# Patient Record
Sex: Female | Born: 1940
Health system: Southern US, Community
[De-identification: ages and names within clinical notes are randomized; demographics above are authoritative.]

## PROBLEM LIST (undated history)

## (undated) DIAGNOSIS — E785 Hyperlipidemia, unspecified: Secondary | ICD-10-CM

## (undated) DIAGNOSIS — T7840XA Allergy, unspecified, initial encounter: Secondary | ICD-10-CM

## (undated) DIAGNOSIS — F419 Anxiety disorder, unspecified: Secondary | ICD-10-CM

## (undated) DIAGNOSIS — K635 Polyp of colon: Secondary | ICD-10-CM

## (undated) DIAGNOSIS — R011 Cardiac murmur, unspecified: Secondary | ICD-10-CM

## (undated) DIAGNOSIS — I509 Heart failure, unspecified: Secondary | ICD-10-CM

## (undated) DIAGNOSIS — I519 Heart disease, unspecified: Secondary | ICD-10-CM

## (undated) DIAGNOSIS — I10 Essential (primary) hypertension: Secondary | ICD-10-CM

## (undated) HISTORY — PX: ABDOMINAL HYSTERECTOMY: SHX81

## (undated) HISTORY — DX: Heart failure, unspecified: I50.9

## (undated) HISTORY — PX: BREAST EXCISIONAL BIOPSY: SUR124

## (undated) HISTORY — DX: Polyp of colon: K63.5

## (undated) HISTORY — PX: BREAST SURGERY: SHX581

## (undated) HISTORY — DX: Allergy, unspecified, initial encounter: T78.40XA

---

## 1999-05-19 ENCOUNTER — Encounter: Payer: Self-pay | Admitting: Obstetrics & Gynecology

## 1999-05-19 ENCOUNTER — Encounter: Admission: RE | Admit: 1999-05-19 | Discharge: 1999-05-19 | Payer: Self-pay | Admitting: Family Medicine

## 2000-05-23 ENCOUNTER — Encounter: Payer: Self-pay | Admitting: Obstetrics & Gynecology

## 2000-05-23 ENCOUNTER — Encounter: Admission: RE | Admit: 2000-05-23 | Discharge: 2000-05-23 | Payer: Self-pay | Admitting: Obstetrics & Gynecology

## 2001-05-28 ENCOUNTER — Encounter: Admission: RE | Admit: 2001-05-28 | Discharge: 2001-05-28 | Payer: Self-pay | Admitting: Obstetrics & Gynecology

## 2001-05-28 ENCOUNTER — Encounter: Payer: Self-pay | Admitting: Obstetrics & Gynecology

## 2001-07-19 ENCOUNTER — Ambulatory Visit (HOSPITAL_BASED_OUTPATIENT_CLINIC_OR_DEPARTMENT_OTHER): Admission: RE | Admit: 2001-07-19 | Discharge: 2001-07-19 | Payer: Self-pay | Admitting: General Surgery

## 2001-11-12 ENCOUNTER — Encounter: Admission: RE | Admit: 2001-11-12 | Discharge: 2001-11-12 | Payer: Self-pay | Admitting: Obstetrics & Gynecology

## 2001-11-12 ENCOUNTER — Encounter: Payer: Self-pay | Admitting: Obstetrics & Gynecology

## 2002-05-29 ENCOUNTER — Encounter: Admission: RE | Admit: 2002-05-29 | Discharge: 2002-05-29 | Payer: Self-pay | Admitting: Obstetrics & Gynecology

## 2002-05-29 ENCOUNTER — Encounter: Payer: Self-pay | Admitting: Obstetrics & Gynecology

## 2003-06-04 ENCOUNTER — Ambulatory Visit (HOSPITAL_COMMUNITY): Admission: RE | Admit: 2003-06-04 | Discharge: 2003-06-04 | Payer: Self-pay | Admitting: General Surgery

## 2003-12-09 ENCOUNTER — Encounter (INDEPENDENT_AMBULATORY_CARE_PROVIDER_SITE_OTHER): Payer: Self-pay | Admitting: Specialist

## 2003-12-09 ENCOUNTER — Ambulatory Visit (HOSPITAL_COMMUNITY): Admission: EM | Admit: 2003-12-09 | Discharge: 2003-12-09 | Payer: Self-pay | Admitting: *Deleted

## 2004-01-06 ENCOUNTER — Ambulatory Visit (HOSPITAL_COMMUNITY): Admission: RE | Admit: 2004-01-06 | Discharge: 2004-01-06 | Payer: Self-pay | Admitting: Obstetrics & Gynecology

## 2004-05-20 ENCOUNTER — Ambulatory Visit: Payer: Self-pay | Admitting: Cardiology

## 2004-06-01 ENCOUNTER — Ambulatory Visit: Payer: Self-pay

## 2004-06-07 ENCOUNTER — Ambulatory Visit (HOSPITAL_COMMUNITY): Admission: RE | Admit: 2004-06-07 | Discharge: 2004-06-07 | Payer: Self-pay | Admitting: Obstetrics & Gynecology

## 2004-07-11 ENCOUNTER — Ambulatory Visit: Payer: Self-pay

## 2005-06-08 ENCOUNTER — Ambulatory Visit (HOSPITAL_COMMUNITY): Admission: RE | Admit: 2005-06-08 | Discharge: 2005-06-08 | Payer: Self-pay | Admitting: Obstetrics & Gynecology

## 2005-06-13 ENCOUNTER — Ambulatory Visit: Payer: Self-pay | Admitting: Cardiology

## 2005-06-20 ENCOUNTER — Encounter: Payer: Self-pay | Admitting: Cardiology

## 2005-06-20 ENCOUNTER — Ambulatory Visit (HOSPITAL_COMMUNITY): Admission: RE | Admit: 2005-06-20 | Discharge: 2005-06-20 | Payer: Self-pay | Admitting: Cardiology

## 2005-06-20 ENCOUNTER — Ambulatory Visit: Payer: Self-pay | Admitting: Cardiology

## 2005-06-26 ENCOUNTER — Encounter: Admission: RE | Admit: 2005-06-26 | Discharge: 2005-06-26 | Payer: Self-pay | Admitting: Obstetrics & Gynecology

## 2005-07-03 ENCOUNTER — Ambulatory Visit: Payer: Self-pay | Admitting: Cardiology

## 2005-07-17 ENCOUNTER — Ambulatory Visit: Payer: Self-pay | Admitting: Cardiology

## 2005-07-19 ENCOUNTER — Ambulatory Visit: Payer: Self-pay | Admitting: Cardiology

## 2005-07-19 ENCOUNTER — Inpatient Hospital Stay (HOSPITAL_BASED_OUTPATIENT_CLINIC_OR_DEPARTMENT_OTHER): Admission: RE | Admit: 2005-07-19 | Discharge: 2005-07-19 | Payer: Self-pay | Admitting: Cardiology

## 2005-08-08 HISTORY — PX: MITRAL VALVE REPAIR: SHX2039

## 2005-10-12 ENCOUNTER — Ambulatory Visit: Payer: Self-pay | Admitting: Cardiology

## 2005-10-13 ENCOUNTER — Ambulatory Visit: Payer: Self-pay

## 2005-10-13 ENCOUNTER — Encounter: Payer: Self-pay | Admitting: Cardiology

## 2005-10-26 ENCOUNTER — Encounter (HOSPITAL_COMMUNITY): Admission: RE | Admit: 2005-10-26 | Discharge: 2006-01-24 | Payer: Self-pay | Admitting: Cardiology

## 2005-11-30 ENCOUNTER — Ambulatory Visit: Payer: Self-pay | Admitting: Cardiology

## 2005-12-04 ENCOUNTER — Ambulatory Visit: Payer: Self-pay | Admitting: Cardiology

## 2005-12-14 ENCOUNTER — Ambulatory Visit: Payer: Self-pay | Admitting: Cardiology

## 2006-01-04 ENCOUNTER — Ambulatory Visit: Payer: Self-pay | Admitting: Internal Medicine

## 2006-01-23 ENCOUNTER — Encounter: Payer: Self-pay | Admitting: Cardiology

## 2006-01-23 ENCOUNTER — Ambulatory Visit: Payer: Self-pay

## 2006-01-30 ENCOUNTER — Ambulatory Visit: Payer: Self-pay | Admitting: Internal Medicine

## 2006-03-08 ENCOUNTER — Ambulatory Visit: Payer: Self-pay | Admitting: Nurse Practitioner

## 2006-03-30 ENCOUNTER — Ambulatory Visit: Payer: Self-pay | Admitting: Internal Medicine

## 2006-05-08 ENCOUNTER — Ambulatory Visit: Payer: Self-pay | Admitting: Cardiology

## 2006-06-14 ENCOUNTER — Ambulatory Visit: Payer: Self-pay | Admitting: Cardiology

## 2006-07-05 ENCOUNTER — Ambulatory Visit (HOSPITAL_COMMUNITY): Admission: RE | Admit: 2006-07-05 | Discharge: 2006-07-05 | Payer: Self-pay | Admitting: Obstetrics & Gynecology

## 2007-01-03 ENCOUNTER — Ambulatory Visit: Payer: Self-pay | Admitting: Internal Medicine

## 2007-01-16 ENCOUNTER — Ambulatory Visit: Payer: Self-pay

## 2007-01-16 ENCOUNTER — Ambulatory Visit: Payer: Self-pay | Admitting: Internal Medicine

## 2007-01-16 LAB — CONVERTED CEMR LAB
AST: 20 units/L (ref 0–37)
Albumin: 4.2 g/dL (ref 3.5–5.2)
Alkaline Phosphatase: 48 units/L (ref 39–117)
CO2: 30 meq/L (ref 19–32)
Cholesterol: 144 mg/dL (ref 0–200)
Creatinine, Ser: 1 mg/dL (ref 0.4–1.2)
GFR calc Af Amer: 72 mL/min
HDL: 49.9 mg/dL (ref >39.0)
Total Bilirubin: 0.6 mg/dL (ref 0.3–1.2)
Triglycerides: 71 mg/dL (ref 0–149)
VLDL: 14 mg/dL (ref 0–40)

## 2007-07-09 ENCOUNTER — Encounter: Admission: RE | Admit: 2007-07-09 | Discharge: 2007-07-09 | Payer: Self-pay | Admitting: Obstetrics & Gynecology

## 2007-07-09 ENCOUNTER — Ambulatory Visit: Payer: Self-pay | Admitting: Internal Medicine

## 2007-07-09 LAB — CONVERTED CEMR LAB
ALT: 19 units/L (ref 0–35)
AST: 20 units/L (ref 0–37)
Alkaline Phosphatase: 48 units/L (ref 39–117)
BUN: 20 mg/dL (ref 6–23)
Bilirubin, Direct: 0.1 mg/dL (ref 0.0–0.3)
CO2: 32 meq/L (ref 19–32)
Chloride: 106 meq/L (ref 96–112)
Creatinine, Ser: 1 mg/dL (ref 0.4–1.2)
HDL: 59 mg/dL (ref >39.0)
Total Protein: 6.9 g/dL (ref 6.0–8.3)

## 2007-10-30 ENCOUNTER — Ambulatory Visit: Payer: Self-pay | Admitting: Internal Medicine

## 2008-05-07 ENCOUNTER — Encounter: Admission: RE | Admit: 2008-05-07 | Discharge: 2008-05-07 | Payer: Self-pay | Admitting: Obstetrics & Gynecology

## 2008-05-08 ENCOUNTER — Encounter: Admission: RE | Admit: 2008-05-08 | Discharge: 2008-05-08 | Payer: Self-pay | Admitting: Obstetrics & Gynecology

## 2008-07-09 ENCOUNTER — Encounter: Admission: RE | Admit: 2008-07-09 | Discharge: 2008-07-09 | Payer: Self-pay | Admitting: Obstetrics & Gynecology

## 2008-07-30 ENCOUNTER — Telehealth: Payer: Self-pay | Admitting: Internal Medicine

## 2008-09-04 ENCOUNTER — Encounter (INDEPENDENT_AMBULATORY_CARE_PROVIDER_SITE_OTHER): Payer: Self-pay | Admitting: *Deleted

## 2008-12-03 ENCOUNTER — Ambulatory Visit: Payer: Self-pay

## 2008-12-03 ENCOUNTER — Encounter: Payer: Self-pay | Admitting: Internal Medicine

## 2008-12-12 DIAGNOSIS — I2510 Atherosclerotic heart disease of native coronary artery without angina pectoris: Secondary | ICD-10-CM | POA: Insufficient documentation

## 2008-12-12 DIAGNOSIS — I080 Rheumatic disorders of both mitral and aortic valves: Secondary | ICD-10-CM | POA: Insufficient documentation

## 2008-12-18 ENCOUNTER — Ambulatory Visit: Payer: Self-pay | Admitting: Internal Medicine

## 2009-01-19 ENCOUNTER — Telehealth: Payer: Self-pay | Admitting: Internal Medicine

## 2009-02-15 ENCOUNTER — Encounter: Admission: RE | Admit: 2009-02-15 | Discharge: 2009-02-15 | Payer: Self-pay | Admitting: Obstetrics & Gynecology

## 2009-03-18 ENCOUNTER — Encounter: Payer: Self-pay | Admitting: Internal Medicine

## 2009-03-18 ENCOUNTER — Ambulatory Visit: Payer: Self-pay | Admitting: Cardiology

## 2009-03-18 ENCOUNTER — Ambulatory Visit: Payer: Self-pay

## 2009-03-18 ENCOUNTER — Ambulatory Visit: Payer: Self-pay | Admitting: Internal Medicine

## 2009-03-18 ENCOUNTER — Ambulatory Visit (HOSPITAL_COMMUNITY): Admission: RE | Admit: 2009-03-18 | Discharge: 2009-03-18 | Payer: Self-pay | Admitting: Internal Medicine

## 2009-07-27 ENCOUNTER — Encounter: Admission: RE | Admit: 2009-07-27 | Discharge: 2009-07-27 | Payer: Self-pay | Admitting: Obstetrics & Gynecology

## 2009-08-02 ENCOUNTER — Encounter: Admission: RE | Admit: 2009-08-02 | Discharge: 2009-08-02 | Payer: Self-pay | Admitting: Obstetrics & Gynecology

## 2009-09-22 ENCOUNTER — Ambulatory Visit: Payer: Self-pay | Admitting: Internal Medicine

## 2010-02-14 ENCOUNTER — Telehealth: Payer: Self-pay | Admitting: Internal Medicine

## 2010-02-28 ENCOUNTER — Telehealth: Payer: Self-pay | Admitting: Internal Medicine

## 2010-03-08 ENCOUNTER — Ambulatory Visit: Payer: Self-pay | Admitting: Cardiology

## 2010-03-08 ENCOUNTER — Encounter: Payer: Self-pay | Admitting: Internal Medicine

## 2010-03-08 ENCOUNTER — Ambulatory Visit: Payer: Self-pay | Admitting: Internal Medicine

## 2010-03-08 ENCOUNTER — Ambulatory Visit (HOSPITAL_COMMUNITY): Admission: RE | Admit: 2010-03-08 | Discharge: 2010-03-08 | Payer: Self-pay | Admitting: Internal Medicine

## 2010-03-16 ENCOUNTER — Ambulatory Visit: Payer: Self-pay | Admitting: Internal Medicine

## 2010-03-31 ENCOUNTER — Encounter: Payer: Self-pay | Admitting: Internal Medicine

## 2010-05-01 ENCOUNTER — Encounter: Payer: Self-pay | Admitting: Obstetrics & Gynecology

## 2010-05-10 NOTE — Progress Notes (Signed)
Summary: returned call from last week  Phone Note Call from Patient   Caller: Patient [phone] Reason for Call: Talk to Nurse Summary of Call: pt returned call from last week/mt Initial call taken by: Glynda Jaeger,  February 28, 2010 10:21 AM  Follow-up for Phone Call        Phone Call Completed PT RESTARTED SIMVASTATIN  HAS PAIN IN L SHOULDER WITH SOME  RADIATION TO WRIST FEELS LIKE STRAIN MUSCLE INSTRUCTED PT TO CONT TAKING STATIN VERBALIZED UNDERSTANDING Follow-up by: Scherrie Bateman, LPN,  February 28, 2010 11:33 AM

## 2010-05-10 NOTE — Assessment & Plan Note (Signed)
Summary: f60m   Visit Type:  6 mo f/u Primary Provider:  Lenn Sink, MD   History of Present Illness: Caitlyn Dunn is a very pleasant 70 year old woman with a history of mitral regurgitation status post mitral valve repair with a ring by Dr. Silvestre Mesi in May 2007.  Preoperatively, her EF was 55%-60% with normal coronaries.  However, postoperatively it dropped down to 25% for unclear reasons.  Echocadriogram 12/10 showed an ejection fraction 45-50% with LVIDed 41mm with mild MR.    She comes today for routine followup. Remains very active with daily activities including yard work, vaccuuming but not exercising regularly. No CP or SOB. No edema or palpitations. Taking Diovan and Coreg regularly.    Current Medications (verified): 1)  Carvedilol 25 Mg Tabs (Carvedilol) .... Take One Tablet By Mouth Twice A Day 2)  Simvastatin 40 Mg Tabs (Simvastatin) .... Take 1 Tablet By Mouth At Bedtime 3)  Diovan 40 Mg Tabs (Valsartan) .... Take One Tablet By Mouth Two Times A Day 4)  Aspirin 81 Mg Tbec (Aspirin) .... Take One Tablet By Mouth Daily 5)  Multivitamins   Tabs (Multiple Vitamin) .... 3 Times Per Week 6)  Calcium Carbonate-Vitamin D 600-400 Mg-Unit  Tabs (Calcium Carbonate-Vitamin D) .... As Needed 7)  Oscal 500/200 D-3 500-200 Mg-Unit Tabs (Calcium-Vitamin D) .... As Needed 8)  Omeprazole 40 Mg Cpdr (Omeprazole) .... Take One Tablet By Mouth Once Daily. 9)  Tums 500 Mg Chew (Calcium Carbonate Antacid) .... As Needed  Allergies: 1)  ! Amoxicillin 2)  ! Demerol 3)  ! Quinine 4)  ! Tylenol  Past History:  Past Medical History: 1. History of mitral regurgitation status post mitral valve repair with a ring by Dr. Silvestre Mesi in May 2007.  2. LV dyfunction     --pre-op 55-60%     --post-op EF 25%     --Echo 12/10: EF 45-50% mild MR 2. HTN 3. Hyperlipidemia 4. Anxiety 5. Famil history of CAD  Review of Systems       As per HPI and past medical history; otherwise all systems  negative.   Vital Signs:  Patient profile:   70 year old female Height:      63 inches Weight:      150 pounds BMI:     26.67 Pulse rate:   68 / minute Pulse rhythm:   regular BP sitting:   124 / 80  (left arm) Cuff size:   regular  Vitals Entered By: Danielle Rankin, CMA (September 22, 2009 9:59 AM)  Physical Exam  General:  Gen: well appearing. no resp difficulty HEENT: normal Neck: supple. no JVD. Carotids 2+ bilat; no bruits. No lymphadenopathy or thryomegaly appreciated. Cor: PMI nondisplaced. Regular rate & rhythm. No rubs, gallops, murmur. Lungs: clear Abdomen: soft, nontender, nondistended. No hepatosplenomegaly. No bruits or masses. Good bowel sounds. Extremities: no cyanosis, clubbing, rash, edema Neuro: alert & orientedx3, cranial nerves grossly intact. moves all 4 extremities w/o difficulty. affect pleasant    Impression & Recommendations:  Problem # 1:  LEFT VENTRICULAR FUNCTION, DECREASED (ICD-429.2) Doing great. LV function essentially completely recovered. We briefly discussed raiding Diovan but I think current dose is sufficient as she has recovered nicely and is totally asymptomatic. Will repeat echo in 6 months.  Problem # 2:  MITRAL REGURGITATION (ICD-396.3) Doing well s/p repair. she will continue SBE prophylaxis as she has an abnormal valve.  Other Orders: EKG w/ Interpretation (93000)  Patient Instructions: 1)  Your physician has requested that  you have an echocardiogram.  Echocardiography is a painless test that uses sound waves to create images of your heart. It provides your doctor with information about the size and shape of your heart and how well your heart's chambers and valves are working.  This procedure takes approximately one hour. There are no restrictions for this procedure.  Needs in 6 months 2)  Your physician wants you to follow-up in:  6 months. You will receive a reminder letter in the mail two months in advance. If you don't receive a letter,  please call our office to schedule the follow-up appointment.

## 2010-05-10 NOTE — Progress Notes (Signed)
Summary: re joint pain  Phone Note Call from Patient   Caller: Patient Summary of Call: pt having joint pain-pt on simvastatin-[phone] Initial call taken by: Omer Jack,  February 14, 2010 1:57 PM  Follow-up for Phone Call        02/14/10--1700pm--pt states she has been on statin for "years" but is now holding as she has developed pain in joints and generalized achy feeling--she has d/ced statin and will wait until you phone back with something else for her to try--advised to hold statin until we get back to her--nt Follow-up by: Ledon Snare, RN,  February 14, 2010 5:25 PM     Appended Document: re joint pain In looking through clinic notes it doesn't appear as if we have been managing lipids. is that the case? if so, she should contact PCP to discuss.   Appended Document: re joint pain we follow lipids, per Dr Gala Romney call and check on pt if better have her restart simva and see if pain returns, have called pt and Left message to call back   Appended Document: re joint pain Left message to call back

## 2010-05-12 NOTE — Assessment & Plan Note (Signed)
Summary: f3m   Visit Type:  Follow-up Primary Provider:  Lenn Sink, MD  CC:  arm pain.  History of Present Illness: Caitlyn Dunn  is a very pleasant 70 year old woman with a history of mitral regurgitation status post mitral valve repair with a ring by Dr. Silvestre Mesi in May 2007.  Preoperatively, her EF was 55%-60% with normal coronaries.  However, postoperatively it dropped down to 25% for unclear reasons.  Echocadriogram 12/10 showed an ejection fraction 45-50% with LVIDed 41mm with mild MR.    Echo 11/11 showed EF 55-60% with no MR. (reviewed with her today in clinic)  Doing well from a cardiac standpoint though not very active. No CP, SOB or edema. Having problems with pain in left shoulder. Compliant with meds. BP well controlled but feels fatigued after taking meds.  Current Medications (verified): 1)  Carvedilol 25 Mg Tabs (Carvedilol) .... Take One Tablet By Mouth Twice A Day 2)  Simvastatin 40 Mg Tabs (Simvastatin) .... Take 1 Tablet By Mouth At Bedtime 3)  Diovan 40 Mg Tabs (Valsartan) .... Take One Tablet By Mouth Two Times A Day 4)  Aspirin 81 Mg Tbec (Aspirin) .... Take One Tablet By Mouth Daily 5)  Multivitamins   Tabs (Multiple Vitamin) .Marland Kitchen.. 1-2 A Week Maybe 6)  Calcium Carbonate-Vitamin D 600-400 Mg-Unit  Tabs (Calcium Carbonate-Vitamin D) .... Once in Awhile 7)  Oscal 500/200 D-3 500-200 Mg-Unit Tabs (Calcium-Vitamin D) .... Once in Awhile 8)  Omeprazole 40 Mg Cpdr (Omeprazole) .... Take One Tablet By Mouth Once Daily. 9)  Clindamycin Hcl 150 Mg Caps (Clindamycin Hcl) .... 4 Tabs Prior To Dental Work  Allergies (verified): 1)  ! Amoxicillin 2)  ! Demerol 3)  ! Quinine  Past History:  Past Medical History: Last updated: 09/22/2009 1. History of mitral regurgitation status post mitral valve repair with a ring by Dr. Silvestre Mesi in May 2007.  2. LV dyfunction     --pre-op 55-60%     --post-op EF 25%     --Echo 12/10: EF 45-50% mild MR 2. HTN 3. Hyperlipidemia 4.  Anxiety 5. Famil history of CAD  Review of Systems       As per HPI and past medical history; otherwise all systems negative.   Vital Signs:  Patient profile:   70 year old female Height:      63 inches Weight:      154 pounds BMI:     27.38 Pulse rate:   61 / minute BP sitting:   122 / 74  (left arm) Cuff size:   regular  Vitals Entered By: Hardin Negus, RMA (March 31, 2010 11:07 AM)   Physical Exam  General:  Well appearing. no resp difficulty HEENT: normal Neck: supple. no JVD. Carotids 2+ bilat; no bruits. No lymphadenopathy or thryomegaly appreciated. Cor: PMI nondisplaced. Regular rate & rhythm. No rubs, gallops, murmur. Lungs: clear Abdomen: soft, nontender, nondistended. No hepatosplenomegaly. No bruits or masses. Good bowel sounds. Extremities: no cyanosis, clubbing, rash, edema Neuro: alert & orientedx3, cranial nerves grossly intact. moves all 4 extremities w/o difficulty. affect pleasant    Impression & Recommendations:  Problem # 1:  MITRAL REGURGITATION (ICD-396.3) Doing well s/p repair. No MR on exam or by echo.   Problem # 2:  LEFT VENTRICULAR FUNCTION, DECREASED (ICD-429.2) Doing great. LV function  completely recovered. Given fatigue will try decreasing carvedilol to 12.5 two times a day and see if she feels better.   Other Orders: EKG w/ Interpretation (93000)  Patient Instructions: 1)  Decrease Carvedilol to 12.5mg  two times a day  2)  Your physician has requested that you have an echocardiogram.  Echocardiography is a painless test that uses sound waves to create images of your heart. It provides your doctor with information about the size and shape of your heart and how well your heart's chambers and valves are working.  This procedure takes approximately one hour. There are no restrictions for this procedure.  NEEDS IN 1 YEAR. 3)  Your physician wants you to follow-up in:  1 year.  You will receive a reminder letter in the mail two months in  advance. If you don't receive a letter, please call our office to schedule the follow-up appointment.

## 2010-07-06 ENCOUNTER — Inpatient Hospital Stay (INDEPENDENT_AMBULATORY_CARE_PROVIDER_SITE_OTHER)
Admission: RE | Admit: 2010-07-06 | Discharge: 2010-07-06 | Disposition: A | Discharge status: Home / Self Care | Payer: MEDICARE | Source: Ambulatory Visit | Attending: Emergency Medicine | Admitting: Emergency Medicine

## 2010-07-06 ENCOUNTER — Encounter: Payer: Self-pay | Admitting: Emergency Medicine

## 2010-07-06 ENCOUNTER — Ambulatory Visit
Admission: RE | Admit: 2010-07-06 | Discharge: 2010-07-06 | Disposition: A | Discharge status: Home / Self Care | Payer: Medicare Other | Source: Ambulatory Visit | Attending: Emergency Medicine | Admitting: Emergency Medicine

## 2010-07-06 ENCOUNTER — Other Ambulatory Visit: Payer: Self-pay | Admitting: Emergency Medicine

## 2010-07-06 DIAGNOSIS — M25579 Pain in unspecified ankle and joints of unspecified foot: Secondary | ICD-10-CM

## 2010-07-12 NOTE — Assessment & Plan Note (Signed)
Summary: POSSIBLE SPRAIN OF LEFT ANKLE? NH rm 5   Vital Signs:  Patient Profile:   70 Years Old Female CC:      LT ankle injury last night Height:     63 inches Weight:      152 pounds O2 Sat:      97 % O2 treatment:    Room Air Temp:     98.3 degrees F oral Pulse rate:   70 / minute Resp:     16 per minute BP sitting:   117 / 75  (left arm) Cuff size:   regular  Vitals Entered By: Clemens Catholic LPN (July 06, 2010 10:45 AM)                  Updated Prior Medication List: CARVEDILOL 12.5 MG TABS (CARVEDILOL) Take one tablet by mouth twice a day SIMVASTATIN 40 MG TABS (SIMVASTATIN) Take 1 tablet by mouth at bedtime DIOVAN 40 MG TABS (VALSARTAN) Take one tablet by mouth two times a day ASPIRIN 81 MG TBEC (ASPIRIN) Take one tablet by mouth daily MULTIVITAMINS   TABS (MULTIPLE VITAMIN) 1-2 a week maybe CALCIUM CARBONATE-VITAMIN D 600-400 MG-UNIT  TABS (CALCIUM CARBONATE-VITAMIN D) once in awhile OSCAL 500/200 D-3 500-200 MG-UNIT TABS (CALCIUM-VITAMIN D) once in awhile OMEPRAZOLE 40 MG CPDR (OMEPRAZOLE) Take one tablet by mouth once daily. CLINDAMYCIN HCL 150 MG CAPS (CLINDAMYCIN HCL) 4 tabs prior to dental work  Current Allergies (reviewed today): ! AMOXICILLIN ! DEMEROL ! QUININEHistory of Present Illness History from: patient Chief Complaint: LT ankle injury last night History of Present Illness: Was walking down her carpeted stairs last night, tripped/slipped and caught herself.  Had ankle pain and has improved a little since then.  She has been wearing a boot today (had it since she broke her L foot a few years ago).  She wants to ensure it's not broken prior to going to [facility] later today.  Pain is sore and on the outside of her ankle.  REVIEW OF SYSTEMS Constitutional Symptoms      Denies fever, chills, night sweats, weight loss, weight gain, and fatigue.  Eyes       Denies change in vision, eye pain, eye discharge, glasses, contact lenses, and eye  surgery. Ear/Nose/Throat/Mouth       Denies hearing loss/aids, change in hearing, ear pain, ear discharge, dizziness, frequent runny nose, frequent nose bleeds, sinus problems, sore throat, hoarseness, and tooth pain or bleeding.  Respiratory       Denies dry cough, productive cough, wheezing, shortness of breath, asthma, bronchitis, and emphysema/COPD.  Cardiovascular       Denies murmurs, chest pain, and tires easily with exhertion.    Gastrointestinal       Denies stomach pain, nausea/vomiting, diarrhea, constipation, blood in bowel movements, and indigestion. Genitourniary       Denies painful urination, kidney stones, and loss of urinary control. Neurological       Denies paralysis, seizures, and fainting/blackouts. Musculoskeletal       Complains of joint pain.      Denies muscle pain, joint stiffness, decreased range of motion, redness, swelling, muscle weakness, and gout.  Skin       Denies bruising, unusual mles/lumps or sores, and hair/skin or nail changes.  Psych       Denies mood changes, temper/anger issues, anxiety/stress, speech problems, depression, and sleep problems. Other Comments: pt states that she fell last night going down the stairs in her house and now she  has LT ankle pain. she put on a cam boot walker that she had from a previous foot surgery and that seemed to help. she took 2- 81mg  ASA this AM, she usually only takes one.   Past History:  Past Medical History: Reviewed history from 09/22/2009 and no changes required. 1. History of mitral regurgitation status post mitral valve repair with a ring by Dr. Silvestre Mesi in May 2007.  2. LV dyfunction     --pre-op 55-60%     --post-op EF 25%     --Echo 12/10: EF 45-50% mild MR 2. HTN 3. Hyperlipidemia 4. Anxiety 5. Famil history of CAD  Past Surgical History: Hysterectomy Mitral valve repair  Family History: mom- Afib/ CVA  Social History: Never Smoked Alcohol use-yes 7 -10 drinks per wk Drug  use-no Smoking Status:  never Drug Use:  no Physical Exam General appearance: well developed, well nourished, mild istress MSE: oriented to time, place, and person L ankle: FROM, full strength, resisted motions not painful.  Mild TTP ATFL and anterior ankle.  No TTP medial/lateral malleolus, navicular, base of 5th, calcaneus, Achilles, or proximal fibula.  No swelling.  No ecchymoses.  Assessment New Problems: ANKLE PAIN (ICD-719.47)   Plan New Orders: New Patient Level III [99203] T-DG Ankle Complete*L* [73610] Planning Comments:   Xray of L ankle obtained.  Read by radiology as normal.  Patient with ankle sprain.  She can continue her boot (but no driving) for a few days. Then gradual return to normalcy.  NSAIDs and ice as needed for pain and swelling.  Consider PT for more ankle strengthening if becomes a recurrant problem.   The patient and/or caregiver has been counseled thoroughly with regard to medications prescribed including dosage, schedule, interactions, rationale for use, and possible side effects and they verbalize understanding.  Diagnoses and expected course of recovery discussed and will return if not improved as expected or if the condition worsens. Patient and/or caregiver verbalized understanding.   Orders Added: 1)  New Patient Level III [99203] 2)  T-DG Ankle Complete*L* [16109]

## 2010-08-15 ENCOUNTER — Other Ambulatory Visit: Payer: Self-pay | Admitting: Obstetrics & Gynecology

## 2010-08-15 ENCOUNTER — Other Ambulatory Visit (HOSPITAL_COMMUNITY): Payer: Self-pay | Admitting: Obstetrics & Gynecology

## 2010-08-15 DIAGNOSIS — Z1231 Encounter for screening mammogram for malignant neoplasm of breast: Secondary | ICD-10-CM

## 2010-08-23 NOTE — Assessment & Plan Note (Signed)
[facility]                            CARDIOLOGY OFFICE NOTE   Caitlyn Dunn, Caitlyn Dunn                      MRN:          [id number]  DATE:10/30/2007                            DOB:          1940/12/19    PRIMARY CARE PHYSICIAN:  Jethro Bastos, MD   INTERVAL HISTORY:  Caitlyn Dunn is a very pleasant 70 year old woman with  a history of mitral regurgitation status post mitral valve repair with a  ring by Dr. Durwin Glaze in May 2007.  Preoperatively, her EF was 55%-60% with  normal coronaries.  However, postoperatively it dropped down to 25% for  unclear reasons.  She is stabilized with an ejection fraction of 45%-  50%.  The last echocardiogram was in October 2008.  There was no  evidence of mitral regurgitation.   She comes today for routine followup.  She is doing great.  She denies  any chest pain or shortness of breath.  Unfortunately, she is not really  working out consistently.  She is following with Dr. Sharrell Ku for  weight loss and has managed to keep her weight in the 145 range,  although she would like to be under 140.  She has not had any problems  with heart failure.   CURRENT MEDICATIONS:  Multivitamin, aspirin 81, Atacand 4 mg a day,  Coreg 25 b.i.d., Tums, and simvastatin 40 bedtime.   DRUG INTOLERANCES/ALLERGIES:  ACE inhibitor causes cough.   PHYSICAL EXAMINATION:  Well-appearing, no acute distress.  Ambulatory on  the clinic without any respiratory difficulty.  Blood pressure is  114/72, heart rate 64, and weight is 146.  HEENT is normal.  Neck is  supple.  No JVD.  Carotids are 2+ bilaterally without any bruits.  There  is no lymphadenopathy or thyromegaly.  PMI is nondisplaced.  She has  regular rate and rhythm.  No murmurs, rubs, or gallops.  No mitral  regurgitation.  Lungs are clear.  Abdomen is soft, nontender, and  nondistended.  No hepatosplenomegaly.  No bruits.  No masses.  Good  bowel sounds.  Extremities are warm.   No cyanosis, clubbing, or edema.  No rash.  Neuro alert and oriented x3.  Cranial nerves II-XII are  intact.  Moves all 4 extremities without difficulty.  Affect is  pleasant.   EKG shows sinus rhythm at a rate of 64, no ST-T wave abnormalities.   ASSESSMENT AND PLAN:  1. Mitral regurgitation status post mitral valve repair.  This is      stable.  We will check her echocardiogram every 2 years.  2. Left ventricular dysfunction.  This is essentially recovered.  Her      EF is low normal.  She is asymptomatic.  She is on a good medical      regimen.  We will keep her on this.  3. Hypertension.  This is stable, well controlled.  Continue current      regimen.   PREVENTIVE MEDICINE:  I did remind her of the need to add exercise into  her program for preserved cardiovascular health.   DISPOSITION:  We  will see her back in 1 year for a routine followup.     Caitlyn Dunn. Bensimhon, MD  Electronically Signed    DRB/MedQ  DD: 10/30/2007  DT: 10/30/2007  Job #: [id number]   cc:   Caitlyn Dunn, M.D.

## 2010-08-23 NOTE — Assessment & Plan Note (Signed)
[facility]                            CARDIOLOGY OFFICE NOTE   Caitlyn Dunn, Caitlyn Dunn                      MRN:          [id number]  DATE:01/03/2007                            DOB:          12-19-1940    PRIMARY CARE PHYSICIAN:  Jethro Bastos, M.D.   INTERVAL HISTORY:  Caitlyn Dunn is a very pleasant 70 year old woman,  previously followed by Dr. Samule Ohm.  She has a history of mitral  regurgitation and is status post mitral valve repair with a ring by Dr.  Silvestre Mesi in May of 2007.  Preoperatively, her EF was 55-60%.  However,  postoperatively, it dropped down to 25%.  Most recent echocardiogram in  October of 2007 showed an EF of 40-50%.  The mitral repair was stable  with no significant mitral regurgitation.   She comes today for routine followup.  She says she is doing very well.  She denies any chest pain or shortness of breath.  She is currently  working with Dr. Ritta Slot to lose weight and she has lost 15 pounds.  She has also started an exercise class and she denies any chest pain or  shortness of breath.  She is somewhat frustrated that her weight has  plateaued and she is unable to get down to her goal of 135.   CURRENT MEDICATIONS:  1. Simvastatin 80 a day.  2. Nexium 40 a day.  3. Aspirin 81.  4. Atacand 4.  5. Coreg 25 b.i.d.  6. Tums.  7. Calcium.  8. Also a multivitamin.   DRUG INTOLERANCES/ALLERGIES:  ACE INHIBITOR causes cough.   PHYSICAL EXAM:  She is well-appearing, in no acute distress, ambulates  around the clinic without any respiratory difficulty.  Blood pressure is 114/80, heart rate 67, weight is 149.  HEENT:  Normal.  NECK:  Supple, no JVD.  Carotids are 2+ bilaterally without any bruits.  There is no lymphadenopathy or thyromegaly.  CARDIAC:  PMI is nondisplaced.  She has a regular rate and rhythm, no  murmurs, rubs or gallops, no clicks.  There is no mitral regurgitation.  LUNGS:  Clear.  ABDOMEN:  Soft,  nontender, nondistended.  There is no  hepatosplenomegaly, no bruits, no masses, good bowel sounds.  EXTREMITIES:  Warm with no cyanosis, clubbing or edema.  No rash.  NEUROLOGIC:  Alert and oriented times three.  Cranial nerves II through  XII are intact.  Moves all four extremities without difficulty.  Affect  is pleasant.   EKG shows sinus rhythm at a rate of 67, no STT-wave abnormalities.   ASSESSMENT AND PLAN:  1. Mitral regurgitation, status post mitral valve repair.  This is      stable.  Continue current therapy.  2. Left ventricular dysfunction.  This is recovered, essentially.  We      will go ahead and check a repeat echocardiogram, just to see if she      has gotten fully back to normal.  She does not have any significant      heart failure symptoms.  3. Hyperlipidemia.  We will check her lipids,  continue Zocor for now.      She is very interested in decreasing her dose, as she thinks she is      on too much.  I told her that, given the fact she does not have      coronary disease, I would like to see her LDL at least under 100.      We will recehck her lipids and see if we can help her decrease her      dose.  Hopefully exercise and diet will help.  4. Weight-loss.  I have encouraged her on her weight-loss and told her      that I would avoid dietary supplements and would just continue with      exercise and diet.   DISPOSITION:  We will see her back in one year for routine followup.     [name], MD  Electronically Signed    DRB/MedQ  DD: 01/03/2007  DT: 01/04/2007  Job #: [id number]   cc:   Jethro Bastos, M.D.  Griffith Citron, M.D.

## 2010-08-26 NOTE — Assessment & Plan Note (Signed)
HEALTHCARE                   COUMADIN / CHRONIC HEART FAILURE CLINIC NOTE   Caitlyn, Dunn                      MRN:          [id number]  DATE:01/04/2006                            DOB:          Jan 02, 1941    CONGESTIVE HEART FAILURE CONSULTATION   REFERRING PHYSICIAN:  Salvadore Farber, MD.   PRIMARY CARE PHYSICIAN:  Jethro Bastos, MD.   PATIENT IDENTIFICATION:  [name] is a very pleasant, 70 year old woman,  who is referred to the Heart Failure Clinic for a medication titration.   PROBLEM LIST:  1. Severe mitral regurgitation secondary to mitral valve prolapse.      a.     Status post mitral valve repair by Dr. Silvestre Mesi In May of 2007.      b.     Preop EF was 55 to 65%.  Postop echocardiogram, July 2007,       showed an EF of 25%, the mitral valve repair was intact.  There was       global LV dysfunction with just a minimal dilation of the ventricle       with an LV end diastolic dimension of 48 mm.      c.     Preop cardiac catheterization with minimal nonobstructive       coronary artery disease.  2. Hypertension.  3. Hyperlipidemia.  4. Obesity.  5. Gastroesophageal reflux disease.   CURRENT MEDICATIONS:  1. Aspirin 81 a day.  2. Multivitamin.  3. Nexium 40.  4. Simvastatin 80.  5. Lisinopril 10.  6. Coreg 3.125 b.i.d.   ALLERGIES:  1. AMOXICILLIN.  2. QUININE.  3. POSSIBLY TYLENOL.   SOCIAL HISTORY:  She lives with her husband, she does not smoke, occasional  alcoholic drink.   FAMILY HISTORY:  Father died from alcoholism cirrhosis, mother died at 49  after a stroke in the setting of A-fib, she has a brother, who had a valve  repair.   INTERVAL HISTORY:  [name] presents today for evaluation in the Heart  Failure Clinic for a medication titration.  She underwent mitral valve  repair with Dr. Gasper Lloyd at [facility] in May of 2007 for a severe mitral  regurgitation in the setting of mitral valve prolapse.  Her  preop  echocardiogram showed normal LV function.  Unfortunately, postoperatively  she was found to have an EF of 25% with global hypokinesis.  The etiology of  this was unclear, she did have a Holter monitor, which did not show any  significant arrhythmias.  She was started on Lisinopril and Coreg.  Currently, she is doing Chiropodist, she is walking at least a mile a day at  a brisk pace without any limitation, she is also able to walk up four  flights of stairs with her laundry with just minimal dyspnea.  She has not  had any lower extremity edema nor orthopnea or PND.  She does get occasional  right breast pain.  No syncope or presyncope.   REVIEW OF SYSTEMS:  As per HPI and Problem List.  Otherwise, all systems  negative.   PHYSICAL EXAMINATION:  GENERAL:  She is well-appearing and in no acute  distress.  Ambulates around the clinic without any dyspnea.  VITAL SIGNS:  Blood pressure is 142/92, heart rate is 95, weight is 152.  HEENT:  Sclerae are anicteric, EOMI, there is no xanthelasma, mucous  membranes are moist.  NECK:  Supple, JVP is about 6 cm of water, carotids are 2+ bilaterally  without any bruits, there is no lymphadenopathy or thyromegaly.  CARDIAC:  Regular rate and rhythm, no S3, no MR.  LUNGS:  Clear.  ABDOMEN:  Obese, nontender, nondistended, no hepatosplenomegaly, no bruits,  no masses.  EXTREMITIES:  Warm with no cyanosis, clubbing, or edema, and good pulses.  NEURO:  Alert and oriented x3, cranial nerves II-XII are intact, moves all  four extremities without any difficulty.   ASSESSMENT AND PLAN:  Nonischemic cardiomyopathy in the postoperative period  after mitral valve repair.  Currently, she is a [place] Heart Association,  functional class I.  I suspect she has had some recovery of her left  ventricular function, we will check a baseline echocardiogram to reevaluate.  We will pursue with aggressive titration of her heart failure regimen, we  have doubled  her Coreg to 6.25 mg b.i.d., and we will see her back in clinic  every two to three weeks to continue titration.  Goal would be to get her on  25 mg of Coreg b.i.d. and at least 20 if not 40 mg of Lisinopril.  Given her  excellent functional capacity, she does not meet guidelines for an  aldosterone blocker.  Should her ejection fraction remain depressed despite  maximum medical therapy, she will need to be considered for a possible  defibrillator.       Caitlyn Dunn. Bensimhon, MD    DRB/MedQ  DD:  01/04/2006  DT:  01/06/2006  Job #:  [id number]   cc:   Salvadore Farber, MD  Jethro Bastos, M.D.

## 2010-08-26 NOTE — Assessment & Plan Note (Signed)
[facility]                          CHRONIC HEART FAILURE NOTE   NAME:HOPPERJaquisha, [name]                      MRN:          [id number]  DATE:03/30/2006                            DOB:          1940-07-05    Caitlyn Dunn returns today for further evaluation and medication  titration of her congestive heart failure which is secondary to  nonischemic cardiomyopathy.  Caitlyn Dunn states she has been doing well.  She continues to remain very active. She has been out and about shopping  for Christmas, entertaining guests at her home without any shortness of  breath or increased weakness.  She previously had a bout of diarrhea.  Now is complaining of some constipation and plans to follow up with Dr.  Arlyce Dice for further GI evaluation.  Caitlyn Dunn's primary cardiologist is  Dr. Randa Evens who she saw in September of this year.  Caitlyn Dunn  denies any episodes of chest discomfort.  She has been tolerating her  medications without problems.   PAST MEDICAL HISTORY:  1. Severe mitral regurgitation secondary to mitral valve prolapse,      status post mitral valve repair by Dr. Silvestre Mesi in May of 2007 at      [facility].  Preoperative echo showed an EF of 55-65%.      Postoperative echo initially showed an EF of 25%.  Repeat      echocardiogram in October of this year showed an EF of 40-50%.  2. Hypertension.  3. Hyperlipidemia.   REVIEW OF SYSTEMS:  As stated above.   CURRENT MEDICATIONS:  1. Aspirin 81 mg.  2. Nexium 40 mg.  3. Simvastatin 80 mg.  4. Lisinopril 10 mg.  5. Coreg 9.375 b.i.d.   PHYSICAL EXAMINATION:  VITAL SIGNS:  Weight 151, blood pressure 130/85,  pulse 80.  GENERAL:  Caitlyn Dunn is in no acute distress.  NECK:  No jugular venous distension at 45-degree angle.  LUNGS:  Clear to auscultation.  CARDIOVASCULAR:  S1 and S2.  Regular rate and rhythm.  ABDOMEN:  Soft, nontender.  EXTREMITIES:  Lower extremities without clubbing,  cyanosis or edema.   IMPRESSION:  1. Nonischemic cardiomyopathy in the postoperative period after mitral      valve repair with EF currently 40-50%.  Today I will increase the      patient's Coreg to 18.75 mg and have her follow up with me in four      weeks for further titration.  I will also at the next appointment      have      her schedule an appointment with Dr. Samule Ohm for routine cardiology      visit and I will see the patient back.      Dorian Pod, ACNP  Electronically Signed      [name], MD  Electronically Signed   MB/MedQ  DD: 03/30/2006  DT: 03/31/2006  Job #: [id number]   cc:   Jethro Bastos, M.D.

## 2010-08-26 NOTE — Assessment & Plan Note (Signed)
[facility]                            CARDIOLOGY OFFICE NOTE   Caitlyn Dunn, Caitlyn Dunn                      MRN:          [id number]  DATE:06/14/2006                            DOB:          [birth year]    PRIMARY CARE PHYSICIAN:  Dr. Marny Lowenstein.   HISTORY OF PRESENT ILLNESS:  Caitlyn Dunn is a 70 year old woman who  underwent mitral valve repair by Dr. Silvestre Mesi in May of 2007.  Preoperative ejection fraction was 55-60%.  However, postoperatively, it  dropped to 25%.  We have treated her medically.  She has returned to an  asymptomatic status.  Echo is currently improved.  Repeat was done today  and is pending.   She is now in [place] Heart Association class I.  She has had a cough  and nasal discharge.  Both resolved with switch from ACE inhibitor to  ARB.   CURRENT MEDICATIONS:  1. Multivitamin.  2. Zocor 80 mg daily.  3. Nexium 40 mg daily.  4. Aspirin 81 mg daily.  5. Atacand 4 mg daily.  6. Coreg 25 mg twice daily.   PHYSICAL EXAMINATION:  She is generally well-appearing in no distress.  Heart rate 78, blood pressure 116/80, weight 155 pounds.  Weight is  stable over the past 2 months.  She has no jugular venous distension, thyromegaly, or lymphadenopathy.  LUNGS:  Clear to auscultation.  She has a nondisplaced point of maximal impulse.  There is a regular  rate and rhythm without murmur, rub, or gallop.  ABDOMEN:  Soft, nondistended, nontender.  There is no  hepatosplenomegaly.  Bowel sounds normal.  EXTREMITIES:  Warm without cyanosis, clubbing, or edema, or ulceration.  Carotid pulses 2+ bilaterally without bruit.   ELECTROCARDIOGRAM:  Normal sinus rhythm and is a normal EKG.   IMPRESSION/RECOMMENDATIONS:  1. Cardiomyopathy:  Nonischemic.  Continue angiotensin-receptor      blocker and Coreg at present dose.  Echo was done this morning and      is pending.  2. Hypertension, well-controlled.  3. Hypercholesterolemia:  Managed by  Dr. Dorothe Dunn.     Salvadore Farber, MD  Electronically Signed    WED/MedQ  DD: 06/14/2006  DT: 06/14/2006  Job #: [id number]

## 2010-08-26 NOTE — Assessment & Plan Note (Signed)
[facility]                          CHRONIC HEART FAILURE NOTE   NAME:Caitlyn Dunn, Caitlyn Dunn                      MRN:          [id number]  DATE:03/08/2006                            DOB:          April 17, 1940    Caitlyn Dunn returns today for further evaluation and medication titration  of her congestive heart failure which is secondary to non-ischemic  cardiomyopathy.  Caitlyn Dunn states she has been doing well.  She  complains of a 3-week bout of diarrhea which has ultimately resolved.  No problems otherwise.  Tolerating the Coreg titration at last visit  without lightheadedness, dizziness, orthopnea, PND.  She continues to  remain very active. She has been Christmas shopping.  She continues to  walk an average of 45 minutes daily and maintains a 3-story home.   PAST MEDICAL HISTORY:  Includes:  1. Severe mitral regurgitation secondary to mitral valve prolapse,      status post mitral valve repair by Dr. Silvestre Mesi in May of 2007 at      [facility].  Preop echo showed an EF of 55-65%.  Postop      echocardiogram initially showed an EF of 25%.  Repeat      echocardiogram in October of this year showed an EF of 40-50%.  2. Hypertension.  3. Hyperlipidemia.  4. Obesity.  5. GERD.  6. Hiatal hernia.  7. Status post hysterectomy.  8. Status post several breast biopsies.   CURRENT MEDICATIONS:  1. Aspirin 81 mg daily.  2. Nexium 40 mg daily.  3. Simvastatin 80 mg daily.  4. Lisinopril 10 mg daily.  5. Multivitamin daily.  6. Coreg 9.375 mg b.i.d.   DRUG ALLERGIES:  AMOXICILLIN, questionable allergy to QUINONE, TYLENOL  causes throat swelling.   REVIEW OF SYSTEMS:  As stated above in history of present illness,  otherwise negative.   PHYSICAL EXAMINATION:  Weight today 152 pounds, blood pressure 136/82  with a pulse of 90.  Caitlyn Dunn is in no acute distress.  She has no  jugular venous distention at 45 degree angle.  LUNGS:  Clear to auscultation  bilaterally.  CARDIOVASCULAR:  Reveals an S1, S2, regular rate and rhythm without  murmurs, rubs or gallops.  ABDOMEN:  Soft, nontender, positive bowel sounds.  LOWER EXTREMITIES:  Without clubbing, cyanosis or edema.  NEUROLOGIC:  The patient alert and oriented x3, Cranial nerves II-XII  grossly intact.   IMPRESSION:  Non-ischemic cardiomyopathy in the postoperative period  after mitral valve repair, with ejection fraction currently being 40-  50%.  The patient maintained on Coreg titration.  [name] is  maintaining a Class I status in regards to her heart failure.  Will  increase Coreg dose to 12.5 mg b.i.d. today.  See patient back in 4  weeks for further titration.   PRIMARY CARDIOLOGIST:  Randa Evens.      Dorian Pod, ACNP  Electronically Signed      Salvadore Farber, MD  Electronically Signed   MB/MedQ  DD: 03/08/2006  DT: 03/08/2006  Job #: [id number]   cc:   Jethro Bastos, M.D.

## 2010-08-26 NOTE — Assessment & Plan Note (Signed)
[facility]                              CARDIOLOGY OFFICE NOTE   Caitlyn Dunn, Caitlyn Dunn                      MRN:          [id number]  DATE:11/30/2005                            DOB:          Dec 27, 1940    PRIMARY CARE PHYSICIAN:  Jethro Bastos, M.D.   HISTORY OF PRESENT ILLNESS:  Caitlyn Dunn is a 70 year old lady status post  mitral valve repair by Dr. Silvestre Mesi in late May of this year.  Preoperative  ejection fraction was 55-65%.  Postoperative ejection fraction is now 25%  with global hypokinesis.  There is no residual mitral regurgitation.   Caitlyn Dunn has continued to participate in cardiac rehabilitation.  She  feels that that is not particularly taxing.  She is able to go up and down a  flight of stairs without any dyspnea.  In short, she appears to be in [place] Heart Association class I or II.   At cardiac rehabilitation, she usually has a resting heart rate of 100-110  beats per minute.  This is sinus rhythm.  On one day, she had 2 episodes of  approximately 8 beats of a narrow complex tachycardia at 190-200 beats per  minute.  She has not had any subjective palpitations.   CURRENT MEDICATIONS:  1. Enteric-coated aspirin 81 mg daily.  2. Multivitamin.  3. Nexium 40 mg daily.  4. Simvastatin 80 mg daily.  5. Lisinopril 10 mg daily.   PHYSICAL EXAMINATION:  GENERAL:  She is generally well appearing in no  distress.  VITAL SIGNS:  Heart rate of 105, blood pressure 128/72 and weight of 158  pounds.  Weight is stable over the past 6 weeks.  She has no jugular venous  distention and no thyromegaly.  LUNGS:  Clear to auscultation.  HEART:  She has a non-displaced point of maximal cardiac impulse.  There is  a regular rate and rhythm without murmur, rub or gallop.  ABDOMEN:  The abdomen is soft, nondistended, nontender.  There is no  hepatosplenomegaly.  Bowel sounds are normal.  CHEST:  In the right chest, there is a nicely healed  incision.  EXTREMITIES:  Warm without clubbing, cyanosis, edema or ulceration.  Carotid  pulses 2+ bilaterally without bruits.   ELECTROCARDIOGRAM:  Normal sinus rhythm at 97 beats per minute.  Normal EKG.   IMPRESSION/PLAN:  1. Cardiomyopathy:  New status post mitral valve repair.  Will continue      lisinopril at 10 mg per day and initiate Coreg at 3.125 mg per day.      Differential diagnosis includes pre-existing cardiomyopathy that was      masked by her mitral regurgitation, serving as a pop-off valve versus      perioperative deterioration and cardiac function.  In addition, there      is a small chance that her tachyarrhythmia my be occurring more      frequently than has been demonstrated thus far and has led to      tachycardia-mediated cardiomyopathy.  To assess this, will check a      Holter monitor.  2. Hypertension:  Well controlled.  3. Hypercholesterolemia:  Managed by Dr. Dorothe Pea.  4. Cardiac rehabilitation:  Encouraged her to continue with cardiac      rehabilitation.  Avoid weightlifting.                                 Salvadore Farber, MD    WED/MedQ  DD:  11/30/2005  DT:  11/30/2005  Job #:  [id number]   cc:   Jethro Bastos, MD  Judd Gaudier, M.D. at [facility]

## 2010-08-26 NOTE — Cardiovascular Report (Signed)
NAME:  Caitlyn Dunn, Caitlyn Dunn               ACCOUNT NO.:  [id number]   MEDICAL RECORD NO.:  [id number]          PATIENT TYPE:  OIB   LOCATION:  NA                           FACILITY:  [facility]   PHYSICIAN:  Charlies Constable, M.D. [facility] DATE OF BIRTH:  [birth year]   DATE OF PROCEDURE:  07/19/2005  DATE OF DISCHARGE:                              CARDIAC CATHETERIZATION   PROCEDURE:  Right and left heart catheterization.   HISTORY OF PRESENT ILLNESS:  Caitlyn Dunn is 70 years old and has history of  mitral regurgitation. She recently developed symptoms of dyspnea on exertion  and on transthoracic echo, her ejection fraction had gone down slightly to  56% and she had a transesophageal echo which showed an ejection fraction of  55% to 65% with a mitral regurgitant volume of 50 cc. She was scheduled for  evaluation with right and left heart catheterization for consideration for  possible mitral valve repair and she has an appointment to see Dr. Silvestre Dunn at  [facility] next week.   PROCEDURE:  A right heart catheterization was performed percutaneously to  the right femoral vein using a venous sheath and Swan-Ganz thermodilution  catheter. A left heart catheterization was performed percutaneously via the  right femoral artery using an arterial sheath and 4 French preformed  coronary catheters. A front wall tier puncture was performed with Omnipaque  contrast used. The patient tolerated the procedure well and left the  laboratory in satisfactory condition.   RESULTS:   HEMODYNAMIC DATA:  1.  The right atrial pressure was 2 mean.  2.  The pulmonary artery pressure was 23/5 with a mean of 16.  3.  The pulmonary wedge pressure was 7 with V-waves of 12.  4.  Left ventricular pressure was 140/8.  5.  The aortic pressure was 140/70.  6.  Cardiac output/cardiac index was 3.9/2.2 liters/minute/meter squared by      thermodilution.  7.  Pulmonary artery saturation was 63%.   ANGIOGRAPHIC DATA:  LEFT MAIN  CORONARY ARTERY:  The left main coronary  artery was free of significant disease.   LEFT ANTERIOR DESCENDING ARTERY:  The left anterior descending artery gave  rise to 2 diagonal branches and 3 septal perforators. These and the LAD  popliteal were free of significant disease.   CIRCUMFLEX ARTERY:  The circumflex artery gave rise to a ramus branch, an  atrial branch, 2 marginal branches, and a posterolateral branch. These  vessels are free of significant disease.   RIGHT CORONARY ARTERY:  The right coronary artery is a moderate size vessel  that gave rise to a right ventricular branch, posterior descending branch,  and 2 posterolateral branches. There were slopes and narrowing in the  proximal right coronary artery, which could be related to catheter spasm.   LEFT VENTRICULOGRAM:  The left ventriculogram performed in the RAO  projection showed good LV function with no areas of segmental wall motion  abnormality. There was 2 to 3 plus or moderate mitral regurgitation. The  contrast density was not greater than the contrast density in the LV and  contrast density was not  great enough to see any filling of the pulmonary  veins. An aortic ejection showed no evidence of aortic insufficiency.   CONCLUSION:  1.  Moderate mitral regurgitation with normal left ventricular function and      normal pulmonary artery pressures at rest.  2.  No significant coronary artery disease with possible 30% narrowing in      the ostium of the right coronary artery, which could be related to      catheter spasm.   RECOMMENDATIONS:  The patient is scheduled for a consultation with Dr.  Silvestre Dunn next week. Will also perform a CTX test prior to that visit, to  assess her exercise tolerance and her maximum oxygen consumpti9on to better  quantify her limitations.           ______________________________  Charlies Constable, M.D. [facility]     BB/MEDQ  D:  07/19/2005  T:  07/19/2005  Job:  [id number]   cc:   Salvadore Farber, M.D. [facility]  [address]  [address]  [place]  [place] [address]   Ilda Mori, M.D.  Fax: [phone]   Jethro Bastos, M.D.  Fax: [phone]   Dr. Judd Gaudier  [facility] Cardiac Surgery Dept.

## 2010-08-26 NOTE — Assessment & Plan Note (Signed)
[facility]                               COUMADIN CLINIC NOTE   Caitlyn Dunn, Caitlyn Dunn                      MRN:          [id number]  DATE:01/30/2006                            DOB:          April 23, 1940    Caitlyn Dunn is here today for further evaluation and medication titration of  her congestive heart failure secondary to nonischemic cardiomyopathy.   Caitlyn Dunn underwent a mitral valve repair by Dr. Durwin Glaze at [facility] in May,  2007.  Apparently, her preop EF was 55-65%; however, a postop echocardiogram  done in July showed an EF of 25% with a mitral valve repair intact.  Dr.  Gala Romney repeated patient's echocardiogram just recently that showed an  improvement in her left ventricular ejection fraction, as much as 50%.  There was more focal hypokinesis of the posterior wall, however.   Caitlyn Dunn is doing quite well.  She continues to walk on average 45 minutes  daily.  She has a three story house.  She is up and down the steps without  any shortness of breath.  Denies any episodes of chest pain, presyncope or  syncopal episodes.  No peripheral edema.   Overall, Caitlyn Dunn states that she has been feeling quite well and is  pleased with the results of her echocardiogram.  She continues to tolerate  her medications without any problems.   PAST MEDICAL HISTORY:  1. Severe mitral regurgitation secondary to mitral valve prolapse, status      post mitral valve repair by Dr. Silvestre Mesi in May, 2007 at [facility].      Under this preop, EF was 55-65%.  Postop echocardiogram initially      showing an EF of 25%.  Repeat echocardiogram this month showing an EF      of 40-50%.  2. Hypertension.  3. Hyperlipidemia.  4. Obesity.  5. GERD.   MEDICATIONS:  1. Aspirin 81 mg daily.  2. Nexium 40 mg daily.  3. Simvastatin 80 mg daily.  4. Lisinopril 10 mg daily.  5. Multivitamin daily.  6. Coreg 6.25 mg b.i.d.   REVIEW OF SYSTEMS:  As stated above in the  history of present illness,  otherwise negative.   PHYSICAL EXAMINATION:  VITAL SIGNS:  Weight 152, blood pressure 127/80 with  a pulse of 88.  GENERAL:  Caitlyn Dunn is in no acute distress.  NECK:  No jugular venous distention at a 45 degree angle.  LUNGS:  Clear to auscultation bilaterally.  CARDIOVASCULAR:  S1 and S2.  Regular rate and rhythm.  ABDOMEN:  Soft, nontender.  Positive bowel sounds.  EXTREMITIES:  Lower extremities without clubbing, cyanosis or edema.  NEUROLOGIC:  Alert and oriented x3.   IMPRESSION:  Stable nonischemic cardiomyopathy in the postoperative period  after mitral valve repair, currently at a functional class I with some  recovery in her left ventricular function.   Today I am going to increase her Coreg to 9.375 mg b.i.d.  Continue other  medications and see patient back in four weeks.      ______________________________  Dorian Pod, ACNP  ______________________________  [name], MD   MB/MedQ  DD:  01/30/2006  DT:  01/31/2006  Job #:  [id number]

## 2010-08-26 NOTE — Assessment & Plan Note (Signed)
[facility]                              CARDIOLOGY OFFICE NOTE   Caitlyn Dunn, Caitlyn Dunn                      MRN:          [id number]  DATE:12/14/2005                            DOB:          [birth year]    HISTORY OF PRESENT ILLNESS:  Ms. Harbach is a 70 year old lady, status post  mitral valve repair by Dr. Marquis Lunch in late May 2007.  Preoperative ejection  fraction was 55 to 65%.  Postoperative ejection fraction is now 25% with  global hypokinesis.  There is no residual mitral regurgitation.  Despite  this deterioration in her left ventricular systolic function, she remains in  [place] Heart Association class II.  She has not had any palpitations,  syncope, presyncope.  She has been having very minimal exertional dyspnea  and no PND or orthopnea.   I was concerned that she may be having tachycardia-mediated cardiomyopathy.  Holter, however, showed only some very brief runs of supraventricular  tachycardia.   CURRENT MEDICATIONS:  1. Enteric-coated aspirin 81 mg per day.  2. Multivitamin.  3. Nexium 40 mg per day.  4. Simvastatin 80 mg per day.  5. Lisinopril 10 mg per day.  6. Coreg 3.125 mg twice per day.   PHYSICAL EXAMINATION:  GENERAL:  She is generally well appearing and in no  distress.  VITAL SIGNS:  Heart rate 95, blood pressure 122/70, weight 158 pounds.  Weight is stable.  NECK:  She has no jugular venous distention, no thyromegaly, and no  lymphadenopathy.  Carotid pulses 2+ bilaterally without bruit.  LUNGS:  Clear to auscultation.  HEART:  She has a laterally displaced point of maximal cardiac impulse.  There is a regular rate and rhythm without murmur, rub, or gallop.  ABDOMEN:  Soft, nondistended, and nontender.  There is no  hepatosplenomegaly.  Bowel sounds are normal.  EXTREMITIES:  Warm without clubbing, cyanosis, edema, or ulceration.   IMPRESSION/RECOMMENDATIONS:  1. Cardiomyopathy, new status post mitral valve repair.   Continue      lisinopril.  Increase Coreg to 6.25 mg per day.  Will have her follow      up in the heart failure clinic.  TSH was normal.  2. Hypertension, well controlled.  3. Hypercholesterolemia, managed by Dr. Dorothe Pea.                                 Salvadore Farber, MD    WED/MedQ  DD:  12/14/2005  DT:  12/14/2005  Job #:  [id number]   cc:   Jethro Bastos, M.D.

## 2010-08-26 NOTE — Assessment & Plan Note (Signed)
[facility]                          CHRONIC HEART FAILURE NOTE   NAME:Caitlyn Dunn, Caitlyn Dunn                      MRN:          [id number]  DATE:05/08/2006                            DOB:          12-15-1940    Caitlyn Dunn returns today for followup regarding her nonischemic  cardiomyopathy/congestive heart failure.  Caitlyn Dunn states she has been  doing quite well.  She has been doing some traveling with her husband  since Christmas.  She was in [place] for a week and then down in  [place].  She states that she knows that she has gained weight  because she has been eating more and not exercising as much as she used  to.  She routinely walked 45 minutes daily.  She states she has not done  this since beginning of the year.  She is complaining of some changes in  her vision and has made plans to follow up with her eye doctor.  She  also is complaining of her chronic cough which has become more  worrisome.  She states her cough began when she was started on the  medication for her heart back in 2007.  She denies any orthopnea or PND,  no peripheral edema.  Sleeping okay.  The cough is rather intermittent.  She does not relate it to eating or exercise or change in position.   PAST MEDICAL HISTORY:  1. Congestive heart failure secondary to nonischemic cardiomyopathy.  2. History of severe mitral regurgitation secondary to mitral valve      prolapse, status post mitral valve repair by Dr. Silvestre Mesi in May 2007      at [facility].  Preoperatively, echocardiogram showed an EF of      55-65%.  Postoperative echocardiogram showed an EF of 25%.  Repeat      echocardiogram in October 2007 showed an EF of 40-50%.  3. Hypertension.  4. Hyperlipidemia.  5. GERD/hiatal hernia.  6. Chronic cough since the patient's medications initiated in 2007.   REVIEW OF SYSTEMS:  As stated above.   ALLERGIES:  AMOXICILLIN, questionable QUININE, and intolerance to  TYLENOL  causing throat swelling.   CURRENT MEDICATIONS:  1. Coreg 18.75 b.i.d.  2. Multivitamin.  3. Lisinopril 10.  4. Simvastatin 80.  5. Nexium 40.  6. Aspirin 81.   PHYSICAL EXAMINATION:  VITAL SIGNS:  Weight today is 155 pounds.  Weight  is up 4 pounds from December.  Blood pressure initially 142/92 by  Dinamap.  Manual blood pressure after several minutes 128/84 with a  heart rate of 79.  NECK:  No jugular vein distention at 45-degree angle.  LUNGS:  Clear to auscultation.  CARDIOVASCULAR:  Reveals an S1 and S2, regular rate and rhythm.  ABDOMEN:  Soft, nontender, positive bowel sounds.  LOWER EXTREMITIES:  Without clubbing, cyanosis, or edema.   IMPRESSION:  1. Heart failure/cardiomyopathy status post mitral valve repair.      Volume status appears to be stable at this time in setting of 4-      pound weight gain in 1 month.  2. Chronic cough.  The patient is concerned that her medication is      contributing.  I have discussed the options with the patient.  She      would like to go ahead and try another medication.  I think this      would be appropriate as her ejection fraction has improved over the      last 6 months.  I am going to stop her lisinopril and switch her to      Atacand 4 mg daily.  Also, will go ahead and increase her Coreg to      25 mg b.i.d.  The patient has a followup appointment with Dr.      Samule Ohm, her primary cardiologist, in March.  She will keep that      appointment and then I will be glad to see her in followup after      that.  She knows to call me if she has any problems.      Dorian Pod, ACNP  Electronically Signed      Rollene Rotunda, MD, [facility]  Electronically Signed   MB/MedQ  DD: 05/08/2006  DT: 05/08/2006  Job #: [id number]

## 2010-09-02 ENCOUNTER — Ambulatory Visit
Admission: RE | Admit: 2010-09-02 | Discharge: 2010-09-02 | Disposition: A | Discharge status: Home / Self Care | Payer: MEDICARE | Source: Ambulatory Visit | Attending: Obstetrics & Gynecology | Admitting: Obstetrics & Gynecology

## 2010-09-02 DIAGNOSIS — Z1231 Encounter for screening mammogram for malignant neoplasm of breast: Secondary | ICD-10-CM

## 2010-11-01 ENCOUNTER — Other Ambulatory Visit: Payer: Self-pay | Admitting: Internal Medicine

## 2011-01-16 ENCOUNTER — Other Ambulatory Visit: Payer: Self-pay | Admitting: Internal Medicine

## 2011-01-24 ENCOUNTER — Other Ambulatory Visit (HOSPITAL_COMMUNITY): Payer: Self-pay | Admitting: *Deleted

## 2011-01-24 MED ORDER — VALSARTAN 40 MG PO TABS: 40.0000 mg | ORAL_TABLET | Freq: Two times a day (BID) | ORAL | Status: DC

## 2011-03-14 ENCOUNTER — Telehealth (HOSPITAL_COMMUNITY): Payer: Self-pay | Admitting: *Deleted

## 2011-03-14 DIAGNOSIS — I340 Nonrheumatic mitral (valve) insufficiency: Secondary | ICD-10-CM

## 2011-03-14 NOTE — Telephone Encounter (Signed)
Message copied by Noralee Space on Tue Mar 14, 2011  8:55 AM ------      Message from: Gwen Her      Created: Mon Mar 13, 2011  4:53 PM       Hey, can you put in an order for the echo she is having on Jan 23.  Thanks!

## 2011-03-14 NOTE — Telephone Encounter (Signed)
Called pt and scheduled yearly f/u she is also due for yearly echo, order placed test sch for 1/23

## 2011-03-25 ENCOUNTER — Other Ambulatory Visit: Payer: Self-pay | Admitting: Internal Medicine

## 2011-05-03 ENCOUNTER — Ambulatory Visit (HOSPITAL_COMMUNITY)
Admission: RE | Admit: 2011-05-03 | Discharge: 2011-05-03 | Disposition: A | Discharge status: Home / Self Care | Payer: MEDICARE | Source: Ambulatory Visit | Attending: Internal Medicine | Admitting: Internal Medicine

## 2011-05-03 ENCOUNTER — Encounter (HOSPITAL_COMMUNITY): Payer: MEDICARE

## 2011-05-03 DIAGNOSIS — I340 Nonrheumatic mitral (valve) insufficiency: Secondary | ICD-10-CM

## 2011-05-03 DIAGNOSIS — I059 Rheumatic mitral valve disease, unspecified: Secondary | ICD-10-CM | POA: Insufficient documentation

## 2011-05-03 DIAGNOSIS — I517 Cardiomegaly: Secondary | ICD-10-CM | POA: Diagnosis not present

## 2011-05-03 DIAGNOSIS — I10 Essential (primary) hypertension: Secondary | ICD-10-CM | POA: Diagnosis not present

## 2011-05-03 NOTE — Progress Notes (Signed)
*  PRELIMINARY RESULTS* Echocardiogram 2D Echocardiogram has been performed.  Glean Salen [facility] 05/03/2011, 11:11 AM

## 2011-05-04 ENCOUNTER — Other Ambulatory Visit: Payer: Self-pay

## 2011-05-04 ENCOUNTER — Ambulatory Visit (HOSPITAL_COMMUNITY)
Admission: RE | Admit: 2011-05-04 | Discharge: 2011-05-04 | Disposition: A | Discharge status: Home / Self Care | Payer: MEDICARE | Source: Ambulatory Visit | Attending: Internal Medicine | Admitting: Internal Medicine

## 2011-05-04 ENCOUNTER — Encounter (HOSPITAL_COMMUNITY): Payer: Self-pay

## 2011-05-04 VITALS — BP 116/72 | HR 64 | Wt 159.5 lb

## 2011-05-04 DIAGNOSIS — I080 Rheumatic disorders of both mitral and aortic valves: Secondary | ICD-10-CM | POA: Diagnosis not present

## 2011-05-04 DIAGNOSIS — I059 Rheumatic mitral valve disease, unspecified: Secondary | ICD-10-CM | POA: Diagnosis not present

## 2011-05-04 DIAGNOSIS — I2510 Atherosclerotic heart disease of native coronary artery without angina pectoris: Secondary | ICD-10-CM | POA: Diagnosis not present

## 2011-05-04 DIAGNOSIS — I509 Heart failure, unspecified: Secondary | ICD-10-CM | POA: Insufficient documentation

## 2011-05-04 HISTORY — DX: Anxiety disorder, unspecified: F41.9

## 2011-05-04 HISTORY — DX: Hyperlipidemia, unspecified: E78.5

## 2011-05-04 HISTORY — DX: Heart disease, unspecified: I51.9

## 2011-05-04 HISTORY — DX: Essential (primary) hypertension: I10

## 2011-05-04 HISTORY — DX: Cardiac murmur, unspecified: R01.1

## 2011-05-04 NOTE — Progress Notes (Signed)
HPI:  Caitlyn Dunn is a very pleasant 71 year old woman with a history of mitral regurgitation status post mitral valve repair with a ring by Dr. Silvestre Mesi in May 2007. Preoperatively, her EF was 55%-60% with normal coronaries. However, postoperatively it dropped down to 25% for unclear reasons.   Echocadriogram 12/10 showed an ejection fraction 45-50% with LVIDed 41mm with mild MR.  Echo 11/11 showed EF 55-60% with no MR. Echo 04/2011 with EF 45-50%, diffuse hypokinesis, trivial MR with mead gradient 3 mm Hg and valve area 1.36 cm2  She returns for routine follow up today. She is doing well.  No CP/SOB/edema.  Weight is gradually increasing over the last year, up 7 lbs.  She is eating more.  She has joined [employer] but is not going regularly.  She is compliant with meds.       ROS: All systems negative except as listed in HPI, PMH and Problem List.  Past Medical History  Diagnosis Date  . Heart murmur     s/p MVR by Dr. Silvestre Mesi in 08/2005  . Hyperlipidemia   . Hypertension   . Anxiety   . LV dysfunction     Echo 2010 EF 45-50%, Echo 2011 EF 55-60%  Family history of CAD  Current Outpatient Prescriptions  Medication Sig Dispense Refill  . aspirin 81 MG tablet Take 81 mg by mouth daily.      . carvedilol (COREG) 12.5 MG tablet TAKE ONE TABLET BY MOUTH TWICE DAILY  60 tablet  3  . Multiple Vitamin (MULITIVITAMIN WITH MINERALS) TABS Take 1 tablet by mouth daily.      Marland Kitchen omeprazole (PRILOSEC) 20 MG capsule Take 20 mg by mouth daily.      . simvastatin (ZOCOR) 40 MG tablet TAKE ONE TABLET BY MOUTH AT BEDTIME  30 tablet  6  . valsartan (DIOVAN) 40 MG tablet Take 1 tablet (40 mg total) by mouth 2 (two) times daily.  180 tablet  2     PHYSICAL EXAM: Filed Vitals:   05/04/11 0841  BP: 116/72  Pulse: 64  Weight: 159 lb 8 oz (72.349 kg)  SpO2: 98%   General:  Well appearing. No resp difficulty HEENT: normal Neck: supple. JVP flat. Carotids 2+ bilaterally; no bruits. No lymphadenopathy or  thryomegaly appreciated. Cor: PMI normal. Regular rate & rhythm. No rubs, gallops or murmurs. Lungs: clear Abdomen: soft, nontender, nondistended. No hepatosplenomegaly. No bruits or masses. Good bowel sounds. Extremities: no cyanosis, clubbing, rash, edema Neuro: alert & orientedx3, cranial nerves grossly intact. Moves all 4 extremities w/o difficulty. Affect pleasant.    ECG: NSR 61%, upsloping ST V3-V6   ASSESSMENT & PLAN:

## 2011-05-04 NOTE — Patient Instructions (Signed)
No changes to medications.  Try to make it to the gym several times a week.  Follow up with Dr. Gala Romney in 1 year.

## 2011-05-04 NOTE — Assessment & Plan Note (Signed)
Stable post repair.  Echo looks good.

## 2011-05-04 NOTE — Assessment & Plan Note (Addendum)
NYHA I.  Fluid status stable.  Have reviewed echos and it appears EF is stable at this time.  Will not adjust medications.  No need for lasix at this time.    Patient seen and examined with Ulyess Blossom PA-C. We discussed all aspects of the encounter and reviewed echos together. I agree with the assessment and plan as stated above. EF just minimally reduced. No evidence of HF. Reassured her. Continue current meds.

## 2011-05-31 ENCOUNTER — Other Ambulatory Visit: Payer: Self-pay | Admitting: Internal Medicine

## 2011-06-06 ENCOUNTER — Encounter: Payer: Self-pay | Admitting: *Deleted

## 2011-06-06 ENCOUNTER — Encounter: Payer: Self-pay | Admitting: Family Medicine

## 2011-06-06 ENCOUNTER — Ambulatory Visit (INDEPENDENT_AMBULATORY_CARE_PROVIDER_SITE_OTHER): Payer: MEDICARE | Admitting: Family Medicine

## 2011-06-06 DIAGNOSIS — E785 Hyperlipidemia, unspecified: Secondary | ICD-10-CM

## 2011-06-06 DIAGNOSIS — I10 Essential (primary) hypertension: Secondary | ICD-10-CM

## 2011-06-06 DIAGNOSIS — Z23 Encounter for immunization: Secondary | ICD-10-CM

## 2011-06-06 DIAGNOSIS — I509 Heart failure, unspecified: Secondary | ICD-10-CM | POA: Diagnosis not present

## 2011-06-06 LAB — CBC WITH DIFFERENTIAL/PLATELET
Basophils Absolute: 0.1 10*3/uL (ref 0.0–0.1)
Lymphocytes Relative: 36.3 % (ref 12.0–46.0)
Monocytes Relative: 9 % (ref 3.0–12.0)
Platelets: 268 10*3/uL (ref 150.0–400.0)
RDW: 14 % (ref 11.5–14.6)

## 2011-06-06 LAB — BASIC METABOLIC PANEL
CO2: 28 mEq/L (ref 19–32)
Chloride: 106 mEq/L (ref 96–112)
Sodium: 140 mEq/L (ref 135–145)

## 2011-06-06 LAB — HEPATIC FUNCTION PANEL
ALT: 19 U/L (ref 0–35)
Alkaline Phosphatase: 61 U/L (ref 39–117)
Bilirubin, Direct: 0 mg/dL (ref 0.0–0.3)
Total Protein: 7.3 g/dL (ref 6.0–8.3)

## 2011-06-06 LAB — LIPID PANEL
LDL Cholesterol: 98 mg/dL (ref 0–99)
Total CHOL/HDL Ratio: 3
Triglycerides: 129 mg/dL (ref 0.0–149.0)

## 2011-06-06 NOTE — Progress Notes (Signed)
  Subjective:    Patient ID: Caitlyn Dunn, female    DOB: Oct 13, 1940, 71 y.o.   MRN: [id number]  HPI New to establish.  Previous MD- Modena Jansky.  GI- Medoff.  GYNArlyce Dice.  Cards- Bensimhon.  HTN- chronic problem, pt reports BP is well controlled at home and when saw cards in Jan.  This AM was nervous for new appt and drank 2 large cups of coffee.  Typically running 110s/70s.  Has had machine calibrated and it matches w/ cards.  On Coreg, Diovan.  Denies CP, SOB, HAs, visual changes, edema.  Hyperlipidemia- chronic problem, on Simvastatin.  Denies abd pain, N/V, myalgias.  Is overdue for labs.  CHF- chronic problem, developed after mitral valve repair in 2008 at [facility] w/ Dr Silvestre Mesi.  Following w/ Dr Gala Romney.  No CP, SOB, edema.     Review of Systems For ROS see HPI     Objective:   Physical Exam  Vitals reviewed. Constitutional: She is oriented to person, place, and time. She appears well-developed and well-nourished. No distress.  HENT:  Head: Normocephalic and atraumatic.  Eyes: Conjunctivae and EOM are normal. Pupils are equal, round, and reactive to light.  Neck: Normal range of motion. Neck supple. No thyromegaly present.  Cardiovascular: Normal rate, regular rhythm, normal heart sounds and intact distal pulses.   No murmur heard. Pulmonary/Chest: Effort normal and breath sounds normal. No respiratory distress.  Abdominal: Soft. She exhibits no distension. There is no tenderness.  Musculoskeletal: She exhibits no edema.  Lymphadenopathy:    She has no cervical adenopathy.  Neurological: She is alert and oriented to person, place, and time.  Skin: Skin is warm and dry.  Psychiatric: She has a normal mood and affect. Her behavior is normal.          Assessment & Plan:

## 2011-06-06 NOTE — Assessment & Plan Note (Signed)
New to provider- chronic for pt.  Overdue on labs.  Check labs.  Adjust meds prn

## 2011-06-06 NOTE — Patient Instructions (Signed)
Schedule your complete physical in 6 months We'll notify you of your lab results and make any changes if needed Keep up the good work!  You look great! Call with any questions or concerns Think of [place] as your home base Welcome!  We're glad to have you!

## 2011-06-06 NOTE — Assessment & Plan Note (Signed)
New to provider- chronic for pt.  BP is elevated today but in reviewing last note from cards, BP was excellently control.  Pt's home BP cuff matches readings at cards, she will monitor at home and notify me if readings remain high.  No changes at this time.

## 2011-06-06 NOTE — Assessment & Plan Note (Signed)
New to provider- chronic for pt since mitral valve repair.  Following regularly w/ Dr Gala Romney.  Asymptomatic.  No signs of volume overload.  Will follow along and assist as able.

## 2011-08-01 ENCOUNTER — Other Ambulatory Visit: Payer: Self-pay | Admitting: *Deleted

## 2011-08-01 MED ORDER — CARVEDILOL 12.5 MG PO TABS: 12.5000 mg | ORAL_TABLET | Freq: Two times a day (BID) | ORAL | Status: DC

## 2011-08-08 ENCOUNTER — Telehealth: Payer: Self-pay | Admitting: *Deleted

## 2011-08-08 DIAGNOSIS — Z780 Asymptomatic menopausal state: Secondary | ICD-10-CM

## 2011-08-08 NOTE — Telephone Encounter (Signed)
Yes.  Can have DEXA- needs to have order placed and dx is post-menopausal

## 2011-08-08 NOTE — Telephone Encounter (Signed)
Pt states that she is scheduled to have a mammogram done and would like to know if it would be possible to have a bone density done as well. .Please advise

## 2011-08-09 ENCOUNTER — Other Ambulatory Visit: Payer: Self-pay | Admitting: Family Medicine

## 2011-08-09 DIAGNOSIS — Z780 Asymptomatic menopausal state: Secondary | ICD-10-CM

## 2011-08-09 NOTE — Telephone Encounter (Signed)
Discuss with patient, bone density ordered.

## 2011-08-29 ENCOUNTER — Other Ambulatory Visit: Payer: Self-pay | Admitting: *Deleted

## 2011-08-29 MED ORDER — SIMVASTATIN 40 MG PO TABS: 40.0000 mg | ORAL_TABLET | Freq: Every day | ORAL | Status: DC

## 2011-09-15 ENCOUNTER — Ambulatory Visit: Payer: MEDICARE

## 2011-09-15 ENCOUNTER — Other Ambulatory Visit: Payer: MEDICARE

## 2011-10-09 ENCOUNTER — Other Ambulatory Visit (HOSPITAL_COMMUNITY): Payer: Self-pay | Admitting: Internal Medicine

## 2011-10-10 ENCOUNTER — Telehealth (HOSPITAL_COMMUNITY): Payer: Self-pay | Admitting: *Deleted

## 2011-10-10 MED ORDER — VALSARTAN 40 MG PO TABS: 40.0000 mg | ORAL_TABLET | Freq: Two times a day (BID) | ORAL | Status: DC

## 2011-10-10 NOTE — Telephone Encounter (Signed)
Pt ask rx be sent to [facility] advised would send it now

## 2011-10-10 NOTE — Telephone Encounter (Signed)
Caitlyn Dunn called she needs a refill on her diovan sent in. Thanks.

## 2011-10-20 ENCOUNTER — Ambulatory Visit
Admission: RE | Admit: 2011-10-20 | Discharge: 2011-10-20 | Disposition: A | Discharge status: Home / Self Care | Payer: MEDICARE | Source: Ambulatory Visit | Attending: Family Medicine | Admitting: Family Medicine

## 2011-10-20 DIAGNOSIS — Z780 Asymptomatic menopausal state: Secondary | ICD-10-CM

## 2011-10-20 DIAGNOSIS — Z13820 Encounter for screening for osteoporosis: Secondary | ICD-10-CM | POA: Diagnosis not present

## 2011-10-24 ENCOUNTER — Encounter: Payer: Self-pay | Admitting: *Deleted

## 2011-12-05 ENCOUNTER — Encounter: Payer: Self-pay | Admitting: Family Medicine

## 2011-12-05 ENCOUNTER — Ambulatory Visit (INDEPENDENT_AMBULATORY_CARE_PROVIDER_SITE_OTHER): Payer: MEDICARE | Admitting: Family Medicine

## 2011-12-05 VITALS — BP 120/75 | HR 83 | Temp 97.9°F | Ht 62.25 in | Wt 157.2 lb

## 2011-12-05 DIAGNOSIS — E785 Hyperlipidemia, unspecified: Secondary | ICD-10-CM | POA: Diagnosis not present

## 2011-12-05 DIAGNOSIS — M899 Disorder of bone, unspecified: Secondary | ICD-10-CM | POA: Diagnosis not present

## 2011-12-05 DIAGNOSIS — M8580 Other specified disorders of bone density and structure, unspecified site: Secondary | ICD-10-CM | POA: Insufficient documentation

## 2011-12-05 DIAGNOSIS — I10 Essential (primary) hypertension: Secondary | ICD-10-CM | POA: Diagnosis not present

## 2011-12-05 DIAGNOSIS — Z0000 Encounter for general adult medical examination without abnormal findings: Secondary | ICD-10-CM | POA: Insufficient documentation

## 2011-12-05 DIAGNOSIS — M949 Disorder of cartilage, unspecified: Secondary | ICD-10-CM

## 2011-12-05 DIAGNOSIS — Z780 Asymptomatic menopausal state: Secondary | ICD-10-CM | POA: Diagnosis not present

## 2011-12-05 LAB — LIPID PANEL
Cholesterol: 193 mg/dL (ref 0–200)
LDL Cholesterol: 100 mg/dL — ABNORMAL HIGH (ref 0–99)
Total CHOL/HDL Ratio: 3
VLDL: 26.6 mg/dL (ref 0.0–40.0)

## 2011-12-05 LAB — HEPATIC FUNCTION PANEL
ALT: 18 U/L (ref 0–35)
AST: 23 U/L (ref 0–37)
Alkaline Phosphatase: 61 U/L (ref 39–117)
Bilirubin, Direct: 0 mg/dL (ref 0.0–0.3)
Total Protein: 7.4 g/dL (ref 6.0–8.3)

## 2011-12-05 LAB — CBC WITH DIFFERENTIAL/PLATELET
Basophils Absolute: 0 10*3/uL (ref 0.0–0.1)
Hemoglobin: 13.4 g/dL (ref 12.0–15.0)
Lymphocytes Relative: 33.5 % (ref 12.0–46.0)
Monocytes Relative: 8 % (ref 3.0–12.0)
Neutro Abs: 3.1 10*3/uL (ref 1.4–7.7)
Neutrophils Relative %: 52.2 % (ref 43.0–77.0)
RBC: 4.59 Mil/uL (ref 3.87–5.11)
RDW: 13.8 % (ref 11.5–14.6)

## 2011-12-05 LAB — BASIC METABOLIC PANEL
CO2: 25 mEq/L (ref 19–32)
Chloride: 107 mEq/L (ref 96–112)
Creatinine, Ser: 0.9 mg/dL (ref 0.4–1.2)
Potassium: 4.2 mEq/L (ref 3.5–5.1)

## 2011-12-05 NOTE — Assessment & Plan Note (Signed)
Pt's PE WNL.  UTD on health maintenance.  Check labs.  Anticipatory guidance provided.  

## 2011-12-05 NOTE — Progress Notes (Signed)
  Subjective:    Patient ID: Caitlyn Dunn, female    DOB: 06-Dec-1940, 71 y.o.   MRN: [id number]  HPI Here today for CPE.  Risk Factors: Hyperlipidemia- chronic problem, on Zocor.  No abd pain, N/V, myalgias HTN- chronic problem, on Diovan and Coreg.  Well controlled.  No CP, SOB, HAs, visual changes, edema. Physical Activity: no regular exericse Fall Risk: low risk, very steady on feet Depression: denies current sxs Hearing: normal to conversational tones, whispered voice ADL's: independent Cognitive: normal linear thought process, memory and attention intact Home Safety: lives w/ husband, feels safe at home Height, Weight, BMI, Visual Acuity: see vitals, vision corrected to 20/20 w/ glasses Counseling: UTD on mammo, DEXA, colonoscopy Labs Ordered: See A&P Care Plan: See A&P    Review of Systems Patient reports no vision/ hearing changes, adenopathy,fever, weight change,  persistant/recurrent hoarseness , swallowing issues, chest pain, palpitations, edema, persistant/recurrent cough, hemoptysis, dyspnea (rest/exertional/paroxysmal nocturnal), gastrointestinal bleeding (melena, rectal bleeding), abdominal pain, significant heartburn, bowel changes, GU symptoms (dysuria, hematuria, incontinence), Gyn symptoms (abnormal  bleeding, pain),  syncope, focal weakness, memory loss, numbness & tingling, skin/hair/nail changes, abnormal bruising or bleeding, anxiety, or depression.     Objective:   Physical Exam General Appearance:    Alert, cooperative, no distress, appears stated age  Head:    Normocephalic, without obvious abnormality, atraumatic  Eyes:    PERRL, conjunctiva/corneas clear, EOM's intact, fundi    benign, both eyes  Ears:    Normal TM's and external ear canals, both ears  Nose:   Nares normal, septum midline, mucosa normal, no drainage    or sinus tenderness  Throat:   Lips, mucosa, and tongue normal; teeth and gums normal  Neck:   Supple, symmetrical, trachea midline, no  adenopathy;    Thyroid: no enlargement/tenderness/nodules  Back:     Symmetric, no curvature, ROM normal, no CVA tenderness  Lungs:     Clear to auscultation bilaterally, respirations unlabored  Chest Wall:    No tenderness or deformity   Heart:    Regular rate and rhythm, S1 and S2 normal, no murmur, rub   or gallop  Breast Exam:    Deferred  Abdomen:     Soft, non-tender, bowel sounds active all four quadrants,    no masses, no organomegaly  Genitalia:    Deferred  Rectal:    Extremities:   Extremities normal, atraumatic, no cyanosis or edema  Pulses:   2+ and symmetric all extremities  Skin:   Skin color, texture, turgor normal, no rashes or lesions  Lymph nodes:   Cervical, supraclavicular, and axillary nodes normal  Neurologic:   CNII-XII intact, normal strength, sensation and reflexes    throughout          Assessment & Plan:

## 2011-12-05 NOTE — Patient Instructions (Addendum)
Follow up in 6 months to recheck cholesterol You look great!  Keep up the good work! Call your insurance company and check on the shingles coverage We'll notify you of your lab results Call with any questions or concerns Happy [birth year]ated Birthday!!!

## 2011-12-05 NOTE — Assessment & Plan Note (Signed)
New according to most recent DEXA.  On Ca+D.  Check Vit D level, replete prn.

## 2011-12-05 NOTE — Assessment & Plan Note (Signed)
Chronic problem, tolerating statin w/out difficulty.  Check labs.  Adjust meds prn  

## 2011-12-05 NOTE — Assessment & Plan Note (Signed)
Chronic problem.  Excellent control.  Asymptomatic.  No changes. 

## 2011-12-06 ENCOUNTER — Encounter: Payer: Self-pay | Admitting: *Deleted

## 2011-12-08 LAB — VITAMIN D 1,25 DIHYDROXY
Vitamin D 1, 25 (OH)2 Total: 44 pg/mL (ref 18–72)
Vitamin D2 1, 25 (OH)2: <8 pg/mL
Vitamin D3 1, 25 (OH)2: 44 pg/mL

## 2011-12-12 ENCOUNTER — Encounter: Payer: Self-pay | Admitting: *Deleted

## 2012-02-23 ENCOUNTER — Other Ambulatory Visit: Payer: Self-pay

## 2012-02-23 MED ORDER — CARVEDILOL 12.5 MG PO TABS: 12.5000 mg | ORAL_TABLET | Freq: Two times a day (BID) | ORAL | Status: DC

## 2012-02-27 ENCOUNTER — Other Ambulatory Visit: Payer: Self-pay | Admitting: Internal Medicine

## 2012-03-27 ENCOUNTER — Other Ambulatory Visit: Payer: Self-pay | Admitting: Internal Medicine

## 2012-06-09 ENCOUNTER — Ambulatory Visit (INDEPENDENT_AMBULATORY_CARE_PROVIDER_SITE_OTHER): Payer: MEDICARE | Admitting: Emergency Medicine

## 2012-06-09 VITALS — BP 140/78 | HR 75 | Temp 98.0°F | Resp 17 | Ht 62.5 in | Wt 156.0 lb

## 2012-06-09 DIAGNOSIS — L259 Unspecified contact dermatitis, unspecified cause: Secondary | ICD-10-CM | POA: Diagnosis not present

## 2012-06-09 MED ORDER — METHYLPREDNISOLONE ACETATE 80 MG/ML IJ SUSP
80.0000 mg | Freq: Once | INTRAMUSCULAR | Status: AC
  Administered 2012-06-09: 80 mg via INTRAMUSCULAR

## 2012-06-09 NOTE — Progress Notes (Signed)
[facility] [address] [place] [address] [phone]  Date:  06/09/2012   Name:  Caitlyn Dunn   DOB:  Aug 24, 1940   MRN:  [id number]  PCP:  Neena Rhymes, MD    Chief Complaint: Herpes Zoster   History of Present Illness:  Caitlyn Dunn is a 72 y.o. very pleasant female patient who presents with the following:  Rash started on face Tuesday.  Now has worsened eruption on right side of forehead.  It is pruritic. No history of new allergen contact.  Had varicella as kid.  Rash is not painful.  Patient Active Problem List  Diagnosis  . MITRAL REGURGITATION  . LEFT VENTRICULAR FUNCTION, DECREASED  . ANKLE PAIN  . Hyperlipidemia  . Hypertension  . CHF (congestive heart failure), NYHA class I  . Physical exam, annual  . Osteopenia    Past Medical History  Diagnosis Date  . Heart murmur     s/p MVR by Dr. Silvestre Mesi in 08/2005  . Hyperlipidemia   . Hypertension   . Anxiety   . LV dysfunction     Echo 2010 EF 45-50%, Echo 2011 EF 55-60%  . Colon polyps   . Allergy   . CHF (congestive heart failure)     Past Surgical History  Procedure Laterality Date  . Mitral valve repair  08/2005    by Dr. Silvestre Mesi  . Abdominal hysterectomy    . Breast surgery      History  Substance Use Topics  . Smoking status: Never Smoker   . Smokeless tobacco: Not on file  . Alcohol Use: Yes     Comment: 1-2 drinks wine daily with ice    Family History  Problem Relation Age of Onset  . Stroke Mother   . Heart disease Mother   . Kidney disease Father   . Hypertension Father   . Heart disease Father   . Depression Sister   . Cancer Sister   . Cancer Maternal Aunt   . Cancer Maternal Uncle   . COPD Maternal Grandmother   . Heart disease Maternal Grandfather   . Hypertension Paternal Grandmother     Allergies  Allergen Reactions  . Amoxicillin   . Meperidine Hcl   . Quinine     Medication list has been reviewed and updated.  Current Outpatient  Prescriptions on File Prior to Visit  Medication Sig Dispense Refill  . aspirin 81 MG tablet Take 81 mg by mouth daily.      . Calcium Carbonate-Vit D-Min (CALTRATE PLUS PO) Take 2 tablets by mouth.      . carvedilol (COREG) 12.5 MG tablet Take 1 tablet (12.5 mg total) by mouth 2 (two) times daily with a meal.  60 tablet  6  . carvedilol (COREG) 12.5 MG tablet TAKE ONE TABLET BY MOUTH TWICE DAILY WITH MEALS  60 tablet  5  . omeprazole (PRILOSEC) 20 MG capsule Take 20 mg by mouth daily.      . simvastatin (ZOCOR) 40 MG tablet TAKE ONE TABLET BY MOUTH NIGHTLY AT BEDTIME  30 tablet  5  . valsartan (DIOVAN) 40 MG tablet Take 1 tablet (40 mg total) by mouth 2 (two) times daily.  180 tablet  3  . Multiple Vitamin (MULITIVITAMIN WITH MINERALS) TABS Take 1 tablet by mouth daily.       No current facility-administered medications on file prior to visit.    Review of Systems:  As per HPI, otherwise negative.  Physical Examination: Filed Vitals:   06/09/12 1341  BP: 140/78  Pulse: 75  Temp: 98 F (36.7 C)  Resp: 17   Filed Vitals:   06/09/12 1341  Height: 5' 2.5" (1.588 m)  Weight: 156 lb (70.761 kg)   Body mass index is 28.06 kg/(m^2). Ideal Body Weight: Weight in (lb) to have BMI = 25: 138.6   GEN: WDWN, NAD, Non-toxic, Alert & Oriented x 3 HEENT: Atraumatic, Normocephalic.  Ears and Nose: No external deformity. EXTR: No clubbing/cyanosis/edema NEURO: Normal gait.  PSYCH: Normally interactive. Conversant. Not depressed or anxious appearing.  Calm demeanor.  SKIN:  Discrete erythematous maculo-papular rash on forehead.  1 mm diameter.  No vesicles or pustules.  No crusting.  Some surrounding erythema  Assessment and Plan: Allergic dermatitis Depo medrol   Carmelina Dane, MD

## 2012-06-09 NOTE — Addendum Note (Signed)
Addended by: Carmelina Dane on: 06/09/2012 02:22 PM   Modules accepted: Orders

## 2012-06-10 ENCOUNTER — Telehealth: Payer: Self-pay | Admitting: Radiology

## 2012-06-10 NOTE — Telephone Encounter (Signed)
[facility] called to verify source of the herpes culture. It was from her forehead

## 2012-06-13 ENCOUNTER — Telehealth: Payer: Self-pay | Admitting: Radiology

## 2012-06-13 DIAGNOSIS — R21 Rash and other nonspecific skin eruption: Secondary | ICD-10-CM | POA: Diagnosis not present

## 2012-06-13 NOTE — Telephone Encounter (Signed)
[facility] called about culture report advised viral culture pending/ no wound culture done.

## 2012-06-15 ENCOUNTER — Telehealth: Payer: Self-pay

## 2012-06-15 NOTE — Telephone Encounter (Signed)
Pt wants to know about lab results from when she was her last Sunday and then wants labs faxed to dermatologist (Dr.Stinehelfer) fax number [phone].

## 2012-06-17 NOTE — Telephone Encounter (Signed)
Negative culture

## 2012-06-17 NOTE — Telephone Encounter (Signed)
Faxed negative viral culture to dermatology. Called pt to advise

## 2012-09-15 ENCOUNTER — Other Ambulatory Visit (HOSPITAL_COMMUNITY): Payer: Self-pay | Admitting: Internal Medicine

## 2012-09-20 ENCOUNTER — Other Ambulatory Visit: Payer: Self-pay | Admitting: Internal Medicine

## 2012-11-08 ENCOUNTER — Other Ambulatory Visit: Payer: Self-pay | Admitting: Internal Medicine

## 2012-11-08 NOTE — Telephone Encounter (Signed)
RX request for simvastatin and Carvedilol

## 2012-11-27 ENCOUNTER — Other Ambulatory Visit: Payer: Self-pay

## 2012-11-27 DIAGNOSIS — Z1231 Encounter for screening mammogram for malignant neoplasm of breast: Secondary | ICD-10-CM

## 2012-12-18 ENCOUNTER — Ambulatory Visit
Admission: RE | Admit: 2012-12-18 | Discharge: 2012-12-18 | Disposition: A | Discharge status: Home / Self Care | Payer: MEDICARE | Source: Ambulatory Visit

## 2012-12-18 DIAGNOSIS — Z1231 Encounter for screening mammogram for malignant neoplasm of breast: Secondary | ICD-10-CM

## 2013-02-13 ENCOUNTER — Other Ambulatory Visit: Payer: Self-pay

## 2013-03-16 ENCOUNTER — Other Ambulatory Visit: Payer: Self-pay | Admitting: Internal Medicine

## 2013-03-18 NOTE — Telephone Encounter (Signed)
MUST HAVE APPOINTMENT FOR FURTHER REFILLS

## 2013-03-26 ENCOUNTER — Other Ambulatory Visit (HOSPITAL_COMMUNITY): Payer: Self-pay | Admitting: *Deleted

## 2013-03-26 MED ORDER — VALSARTAN 40 MG PO TABS: 40.0000 mg | ORAL_TABLET | Freq: Every day | ORAL | Status: DC

## 2013-07-06 ENCOUNTER — Other Ambulatory Visit: Payer: Self-pay | Admitting: Adult Health

## 2013-07-08 ENCOUNTER — Other Ambulatory Visit (HOSPITAL_COMMUNITY): Payer: Self-pay

## 2013-08-05 ENCOUNTER — Other Ambulatory Visit (HOSPITAL_COMMUNITY): Payer: Self-pay | Admitting: Cardiology

## 2013-08-05 DIAGNOSIS — I10 Essential (primary) hypertension: Secondary | ICD-10-CM

## 2013-08-05 MED ORDER — VALSARTAN 40 MG PO TABS: 40.0000 mg | ORAL_TABLET | Freq: Two times a day (BID) | ORAL | Status: DC

## 2013-08-05 NOTE — Telephone Encounter (Signed)
Pt called to clairify dose of Valsartan Pt states most recent bottle sent to home readsone pill daily Pt should continue valsartan 40 BID per last office visit rx sent to pharm and pt given follow up appt

## 2013-09-02 ENCOUNTER — Other Ambulatory Visit (HOSPITAL_COMMUNITY): Payer: Self-pay | Admitting: Cardiology

## 2013-09-02 DIAGNOSIS — I509 Heart failure, unspecified: Secondary | ICD-10-CM

## 2013-09-04 ENCOUNTER — Ambulatory Visit (HOSPITAL_BASED_OUTPATIENT_CLINIC_OR_DEPARTMENT_OTHER)
Admission: RE | Admit: 2013-09-04 | Discharge: 2013-09-04 | Disposition: A | Discharge status: Home / Self Care | Payer: MEDICARE | Source: Ambulatory Visit | Attending: Internal Medicine | Admitting: Internal Medicine

## 2013-09-04 ENCOUNTER — Ambulatory Visit (HOSPITAL_COMMUNITY)
Admission: RE | Admit: 2013-09-04 | Discharge: 2013-09-04 | Disposition: A | Discharge status: Home / Self Care | Payer: MEDICARE | Source: Ambulatory Visit | Attending: Family Medicine | Admitting: Family Medicine

## 2013-09-04 ENCOUNTER — Encounter (HOSPITAL_COMMUNITY): Payer: Self-pay

## 2013-09-04 VITALS — BP 128/88 | HR 68 | Wt 160.4 lb

## 2013-09-04 DIAGNOSIS — I509 Heart failure, unspecified: Secondary | ICD-10-CM | POA: Insufficient documentation

## 2013-09-04 DIAGNOSIS — I517 Cardiomegaly: Secondary | ICD-10-CM | POA: Diagnosis not present

## 2013-09-04 DIAGNOSIS — I080 Rheumatic disorders of both mitral and aortic valves: Secondary | ICD-10-CM

## 2013-09-04 DIAGNOSIS — I428 Other cardiomyopathies: Secondary | ICD-10-CM | POA: Diagnosis not present

## 2013-09-04 LAB — BASIC METABOLIC PANEL
BUN: 17 mg/dL (ref 6–23)
CALCIUM: 10 mg/dL (ref 8.4–10.5)
CO2: 25 meq/L (ref 19–32)
CREATININE: 0.81 mg/dL (ref 0.50–1.10)
Chloride: 105 mEq/L (ref 96–112)
GFR, EST AFRICAN AMERICAN: 83 mL/min — AB (ref >90)
GFR, EST NON AFRICAN AMERICAN: 71 mL/min — AB (ref >90)
Glucose, Bld: 98 mg/dL (ref 70–99)
Potassium: 4.7 mEq/L (ref 3.7–5.3)
Sodium: 142 mEq/L (ref 137–147)

## 2013-09-04 NOTE — Progress Notes (Signed)
Patient ID: Caitlyn Dunn, female   DOB: Sep 13, 1940, 73 y.o.   MRN: [id number] HPI:  Caitlyn Dunn is a very pleasant 73 year old woman with a history of mitral regurgitation status post mitral valve repair with a ring by Dr. Evelina Dunn in May 2007. Preoperatively, her EF was 55%-60% with normal coronaries. However, postoperatively it dropped down to 25% for unclear reasons.   Echocardiogram 12/10 showed an ejection fraction 45-50% with LVIDed 59mm with mild MR.  Echo 11/11 showed EF 55-60% with no MR. Echo 04/2011 with EF 45-50%, diffuse hypokinesis, trivial MR with mean gradient 3 mm Hg and valve area 1.36 cm2 Echo (5/15) with EF 50-55%, s/p mitral valve repair with trivial MR, normal RV, and basal inferior hypokinesis.  She returns for routine follow up today. She is doing well.  No CP/SOB/edema.  She is not getting much exercise.  Weight is stable compared to prior appointment.   ROS: All systems negative except as listed in HPI, PMH and Problem Dunn.  Past Medical History  Diagnosis Date  . Heart murmur     s/p MVR by Dr. Evelina Dunn in 08/2005  . Hyperlipidemia   . Hypertension   . Anxiety   . LV dysfunction     Echo 2010 EF 45-50%, Echo 2011 EF 55-60%  . Colon polyps   . Allergy   . CHF (congestive heart failure)   Family history of CAD  Current Outpatient Prescriptions  Medication Sig Dispense Refill  . aspirin 81 MG tablet Take 81 mg by mouth daily.      . Calcium Carbonate-Vit D-Min (CALTRATE PLUS PO) Take 2 tablets by mouth.      . carvedilol (COREG) 12.5 MG tablet Take 1 tablet (12.5 mg total) by mouth 2 (two) times daily with a meal.  60 tablet  6  . Multiple Vitamin (MULITIVITAMIN WITH MINERALS) TABS Take 1 tablet by mouth daily.      Marland Kitchen omeprazole (PRILOSEC) 20 MG capsule Take 20 mg by mouth daily.      . simvastatin (ZOCOR) 40 MG tablet TAKE ONE TABLET BY MOUTH NIGHTLY AT BEDTIME   30 tablet  5  . valsartan (DIOVAN) 40 MG tablet Take 1 tablet (40 mg total) by mouth 2 (two) times  daily.  180 tablet  3   No current facility-administered medications for this encounter.     PHYSICAL EXAM: Filed Vitals:   09/04/13 0913  BP: 128/88  Pulse: 68  Weight: 160 lb 6.4 oz (72.757 kg)  SpO2: 95%   General:  Well appearing. No resp difficulty HEENT: normal Neck: supple. JVP flat. Carotids 2+ bilaterally; no bruits. No lymphadenopathy or thryomegaly appreciated. Cor: PMI normal. Regular rate & rhythm. No rubs, gallops or murmurs. Lungs: clear Abdomen: soft, nontender, nondistended. No hepatosplenomegaly. No bruits or masses. Good bowel sounds. Extremities: no cyanosis, clubbing, rash, edema Neuro: alert & orientedx3, cranial nerves grossly intact. Moves all 4 extremities w/o difficulty. Affect pleasant.  ASSESSMENT & PLAN: 1. S/p mitral valve repair: MV repair appears intact with trivial MR on today's echo. 2. Nonischemic cardiomyopathy: Decreased EF immediately after MV repair likely related to effect of long-standing MR.  I reviewed today's echo, EF up to 50-55%.  Continue Coreg and valsartan.  I will check a BMET today.  I would also like her to increase her exercise level (ideally 4-5 days/week for 30 minutes).    Caitlyn Dunn 09/04/2013

## 2013-09-04 NOTE — Progress Notes (Signed)
  Echocardiogram 2D Echocardiogram has been performed.  Carney Corners 09/04/2013, 8:17 AM

## 2013-09-04 NOTE — Patient Instructions (Signed)
Lab today  We will contact you in 1 year to schedule your next appointment.

## 2013-12-23 ENCOUNTER — Other Ambulatory Visit: Payer: Self-pay

## 2013-12-23 DIAGNOSIS — Z1231 Encounter for screening mammogram for malignant neoplasm of breast: Secondary | ICD-10-CM

## 2013-12-31 ENCOUNTER — Other Ambulatory Visit: Payer: Self-pay

## 2013-12-31 MED ORDER — SIMVASTATIN 40 MG PO TABS: ORAL_TABLET | ORAL | Status: DC

## 2014-01-02 ENCOUNTER — Other Ambulatory Visit (HOSPITAL_COMMUNITY): Payer: Self-pay

## 2014-01-02 MED ORDER — SIMVASTATIN 40 MG PO TABS: ORAL_TABLET | ORAL | Status: DC

## 2014-01-06 ENCOUNTER — Other Ambulatory Visit: Payer: Self-pay | Admitting: Internal Medicine

## 2014-01-06 ENCOUNTER — Ambulatory Visit
Admission: RE | Admit: 2014-01-06 | Discharge: 2014-01-06 | Disposition: A | Discharge status: Home / Self Care | Payer: MEDICARE | Source: Ambulatory Visit

## 2014-01-06 DIAGNOSIS — Z1231 Encounter for screening mammogram for malignant neoplasm of breast: Secondary | ICD-10-CM

## 2014-01-09 ENCOUNTER — Other Ambulatory Visit (HOSPITAL_COMMUNITY): Payer: Self-pay | Admitting: Cardiology

## 2014-01-09 DIAGNOSIS — I509 Heart failure, unspecified: Secondary | ICD-10-CM

## 2014-01-09 MED ORDER — CARVEDILOL 12.5 MG PO TABS: 12.5000 mg | ORAL_TABLET | Freq: Two times a day (BID) | ORAL | Status: DC

## 2014-01-23 ENCOUNTER — Other Ambulatory Visit: Payer: Self-pay

## 2014-04-22 DIAGNOSIS — L718 Other rosacea: Secondary | ICD-10-CM | POA: Diagnosis not present

## 2014-06-15 ENCOUNTER — Other Ambulatory Visit (HOSPITAL_COMMUNITY): Payer: Self-pay | Admitting: *Deleted

## 2014-06-15 DIAGNOSIS — I10 Essential (primary) hypertension: Secondary | ICD-10-CM

## 2014-06-15 MED ORDER — VALSARTAN 40 MG PO TABS: 40.0000 mg | ORAL_TABLET | Freq: Two times a day (BID) | ORAL | Status: DC

## 2014-06-30 ENCOUNTER — Ambulatory Visit (INDEPENDENT_AMBULATORY_CARE_PROVIDER_SITE_OTHER): Payer: MEDICARE | Admitting: Medical

## 2014-06-30 ENCOUNTER — Encounter: Payer: Self-pay | Admitting: Medical

## 2014-06-30 VITALS — BP 150/80 | HR 73 | Temp 98.2°F | Ht 62.5 in | Wt 163.0 lb

## 2014-06-30 DIAGNOSIS — J0100 Acute maxillary sinusitis, unspecified: Secondary | ICD-10-CM | POA: Insufficient documentation

## 2014-06-30 DIAGNOSIS — J309 Allergic rhinitis, unspecified: Secondary | ICD-10-CM | POA: Insufficient documentation

## 2014-06-30 DIAGNOSIS — J301 Allergic rhinitis due to pollen: Secondary | ICD-10-CM

## 2014-06-30 MED ORDER — AZITHROMYCIN 250 MG PO TABS: ORAL_TABLET | ORAL | Status: DC

## 2014-06-30 MED ORDER — FLUTICASONE PROPIONATE 50 MCG/ACT NA SUSP: 2.0000 | Freq: Every day | NASAL | Status: DC

## 2014-06-30 NOTE — Progress Notes (Signed)
Pre visit review using our clinic review tool, if applicable. No additional management support is needed unless otherwise documented below in the visit note. 

## 2014-06-30 NOTE — Patient Instructions (Signed)
Allergic rhinitis Recent flare. Advise zyrtec otc and rx of flonase nasal spray.   Sinusitis, acute maxillary Your appear to have a sinus infection. I am prescribing azithromycin  antibiotic for the infection. To help with the nasal congestion I prescribed nasal steroid flonase.   Likely has eustachian tube dysfunction as well.   Rest, hydrate, tylenol for fever.  Follow up in 7 days or as needed.

## 2014-06-30 NOTE — Progress Notes (Signed)
Subjective:    Patient ID: Caitlyn Dunn, female    DOB: Nov 16, 1940, 74 y.o.   MRN: [id number]  HPI   Pt in for sore throat. Pain for one week. Pt thought maybe allergies at first. Some swelling of the soft palate. Some sinus pressure and some teeth sensitiivity. Some ear pressure.   Pt nasal congestion and runny nose. Hx of allergies. Year round. Some worse with spring recently.    Review of Systems  Constitutional: Negative for fever, chills and fatigue.  HENT: Positive for congestion, ear pain, postnasal drip, sinus pressure and sore throat.   Respiratory: Positive for cough. Negative for chest tightness and wheezing.   Cardiovascular: Negative for chest pain and palpitations.  Musculoskeletal: Negative for back pain.  Neurological: Negative for dizziness, speech difficulty, weakness, light-headedness and headaches.  Hematological: Negative for adenopathy. Does not bruise/bleed easily.  Psychiatric/Behavioral: Negative for behavioral problems and confusion.   Past Medical History  Diagnosis Date  . Heart murmur     s/p MVR by Dr. Evelina Dun in 08/2005  . Hyperlipidemia   . Hypertension   . Anxiety   . LV dysfunction     Echo 2010 EF 45-50%, Echo 2011 EF 55-60%  . Colon polyps   . Allergy   . CHF (congestive heart failure)     History   Social History  . Marital Status: Married    Spouse Name: N/A  . Number of Children: N/A  . Years of Education: N/A   Occupational History  . Not on file.   Social History Main Topics  . Smoking status: Never Smoker   . Smokeless tobacco: Not on file  . Alcohol Use: Yes     Comment: 1-2 drinks wine daily with ice  . Drug Use: No  . Sexual Activity: Yes    Birth Control/ Protection: None   Other Topics Concern  . Not on file   Social History Narrative    Past Surgical History  Procedure Laterality Date  . Mitral valve repair  08/2005    by Dr. Evelina Dun  . Abdominal hysterectomy    . Breast surgery      Family History    Problem Relation Age of Onset  . Stroke Mother   . Heart disease Mother   . Kidney disease Father   . Hypertension Father   . Heart disease Father   . Depression Sister   . Cancer Sister   . Cancer Maternal Aunt   . Cancer Maternal Uncle   . COPD Maternal Grandmother   . Heart disease Maternal Grandfather   . Hypertension Paternal Grandmother     Allergies  Allergen Reactions  . Amoxicillin   . Meperidine Hcl   . Quinine     Current Outpatient Prescriptions on File Prior to Visit  Medication Sig Dispense Refill  . aspirin 81 MG tablet Take 81 mg by mouth daily.    . carvedilol (COREG) 12.5 MG tablet Take 1 tablet (12.5 mg total) by mouth 2 (two) times daily with a meal. 180 tablet 3  . omeprazole (PRILOSEC) 20 MG capsule Take 20 mg by mouth daily.    . simvastatin (ZOCOR) 40 MG tablet TAKE ONE TABLET BY MOUTH NIGHTLY AT BEDTIME 90 tablet 3  . valsartan (DIOVAN) 40 MG tablet Take 1 tablet (40 mg total) by mouth 2 (two) times daily. 180 tablet 3   No current facility-administered medications on file prior to visit.    BP 166/96 mmHg  Pulse 73  Temp(Src) 98.2 F (36.8 C) (Oral)  Ht 5' 2.5" (1.588 m)  Wt 163 lb (73.936 kg)  BMI 29.32 kg/m2  SpO2 95%       Objective:   Physical Exam  General  Mental Status - Alert. General Appearance - Well groomed. Not in acute distress.  Skin Rashes- No Rashes.  HEENT Head- Normal. Ear Auditory Canal - Left- Normal. Right - Normal.Tympanic Membrane- Left- Normal. Right- Normal. Eye Sclera/Conjunctiva- Left- Normal. Right- Normal. Nose & Sinuses Nasal Mucosa- Left-  Boggy and Congested. Right-  Boggy and  Congested.Faint  Rt side maxillary but  frontal sinus pressure. Mouth & Throat Lips: Upper Lip- Normal: no dryness, cracking, pallor, cyanosis, or vesicular eruption. Lower Lip-Normal: no dryness, cracking, pallor, cyanosis or vesicular eruption. Buccal Mucosa- Bilateral- No Aphthous ulcers. Oropharynx- No Discharge or  Erythema. Tonsils: Characteristics- Bilateral- Mild Erythema +  Congestion. Size/Enlargement- Bilateral- No enlargement. Discharge- bilateral-None.  Neck Neck- Supple. No Masses.   Chest and Lung Exam Auscultation: Breath Sounds:-Clear even and unlabored.  Cardiovascular Auscultation:Rythm- Regular, rate and rhythm. Murmurs & Other Heart Sounds:Ausculatation of the heart reveal- No Murmurs.  Lymphatic Head & Neck General Head & Neck Lymphatics: Bilateral: Description- No Localized lymphadenopathy.       Assessment & Plan:

## 2014-06-30 NOTE — Assessment & Plan Note (Signed)
Your appear to have a sinus infection. I am prescribing azithromycin  antibiotic for the infection. To help with the nasal congestion I prescribed nasal steroid flonase.   Likely has eustachian tube dysfunction as well.   Rest, hydrate, tylenol for fever.  Follow up in 7 days or as needed.

## 2014-06-30 NOTE — Assessment & Plan Note (Signed)
Recent flare. Advise zyrtec otc and rx of flonase nasal spray.

## 2014-08-27 ENCOUNTER — Other Ambulatory Visit (HOSPITAL_COMMUNITY): Payer: Self-pay | Admitting: *Deleted

## 2014-08-27 DIAGNOSIS — I509 Heart failure, unspecified: Secondary | ICD-10-CM

## 2014-08-27 MED ORDER — SIMVASTATIN 40 MG PO TABS: ORAL_TABLET | ORAL | Status: DC

## 2014-08-27 MED ORDER — CARVEDILOL 12.5 MG PO TABS: 12.5000 mg | ORAL_TABLET | Freq: Two times a day (BID) | ORAL | Status: DC

## 2014-10-05 ENCOUNTER — Other Ambulatory Visit: Payer: Self-pay

## 2014-10-29 DIAGNOSIS — L603 Nail dystrophy: Secondary | ICD-10-CM | POA: Diagnosis not present

## 2014-10-29 DIAGNOSIS — L821 Other seborrheic keratosis: Secondary | ICD-10-CM | POA: Diagnosis not present

## 2014-10-29 DIAGNOSIS — L7211 Pilar cyst: Secondary | ICD-10-CM | POA: Diagnosis not present

## 2014-10-29 DIAGNOSIS — Z419 Encounter for procedure for purposes other than remedying health state, unspecified: Secondary | ICD-10-CM | POA: Diagnosis not present

## 2014-10-29 DIAGNOSIS — L814 Other melanin hyperpigmentation: Secondary | ICD-10-CM | POA: Diagnosis not present

## 2014-10-29 DIAGNOSIS — L570 Actinic keratosis: Secondary | ICD-10-CM | POA: Diagnosis not present

## 2014-10-29 DIAGNOSIS — L918 Other hypertrophic disorders of the skin: Secondary | ICD-10-CM | POA: Diagnosis not present

## 2014-10-29 DIAGNOSIS — D1801 Hemangioma of skin and subcutaneous tissue: Secondary | ICD-10-CM | POA: Diagnosis not present

## 2014-11-26 ENCOUNTER — Other Ambulatory Visit (HOSPITAL_COMMUNITY): Payer: Self-pay | Admitting: *Deleted

## 2014-11-26 DIAGNOSIS — I10 Essential (primary) hypertension: Secondary | ICD-10-CM

## 2014-11-26 MED ORDER — VALSARTAN 40 MG PO TABS: 40.0000 mg | ORAL_TABLET | Freq: Two times a day (BID) | ORAL | Status: DC

## 2015-02-23 ENCOUNTER — Other Ambulatory Visit: Payer: Self-pay

## 2015-02-23 DIAGNOSIS — Z1231 Encounter for screening mammogram for malignant neoplasm of breast: Secondary | ICD-10-CM

## 2015-04-01 ENCOUNTER — Ambulatory Visit
Admission: RE | Admit: 2015-04-01 | Discharge: 2015-04-01 | Disposition: A | Discharge status: Home / Self Care | Payer: MEDICARE | Source: Ambulatory Visit

## 2015-04-01 DIAGNOSIS — Z1231 Encounter for screening mammogram for malignant neoplasm of breast: Secondary | ICD-10-CM | POA: Diagnosis not present

## 2015-08-11 ENCOUNTER — Ambulatory Visit (INDEPENDENT_AMBULATORY_CARE_PROVIDER_SITE_OTHER): Payer: MEDICARE | Admitting: Family Medicine

## 2015-08-11 ENCOUNTER — Encounter: Payer: Self-pay | Admitting: Family Medicine

## 2015-08-11 VITALS — BP 134/74 | HR 78 | Temp 98.1°F | Resp 16 | Ht 63.0 in | Wt 162.4 lb

## 2015-08-11 DIAGNOSIS — E559 Vitamin D deficiency, unspecified: Secondary | ICD-10-CM | POA: Diagnosis not present

## 2015-08-11 DIAGNOSIS — Z23 Encounter for immunization: Secondary | ICD-10-CM | POA: Diagnosis not present

## 2015-08-11 DIAGNOSIS — M8580 Other specified disorders of bone density and structure, unspecified site: Secondary | ICD-10-CM | POA: Diagnosis not present

## 2015-08-11 DIAGNOSIS — I5022 Chronic systolic (congestive) heart failure: Secondary | ICD-10-CM

## 2015-08-11 DIAGNOSIS — E785 Hyperlipidemia, unspecified: Secondary | ICD-10-CM

## 2015-08-11 DIAGNOSIS — Z0000 Encounter for general adult medical examination without abnormal findings: Secondary | ICD-10-CM

## 2015-08-11 DIAGNOSIS — I10 Essential (primary) hypertension: Secondary | ICD-10-CM

## 2015-08-11 DIAGNOSIS — Z1211 Encounter for screening for malignant neoplasm of colon: Secondary | ICD-10-CM

## 2015-08-11 LAB — CBC WITH DIFFERENTIAL/PLATELET
Basophils Absolute: 67 cells/uL (ref 0–200)
Basophils Relative: 1 %
Eosinophils Absolute: 603 cells/uL — ABNORMAL HIGH (ref 15–500)
Eosinophils Relative: 9 %
HCT: 40.1 % (ref 35.0–45.0)
Hemoglobin: 13.3 g/dL (ref 11.7–15.5)
Lymphocytes Relative: 33 %
Lymphs Abs: 2211 cells/uL (ref 850–3900)
MCH: 28.7 pg (ref 27.0–33.0)
MCHC: 33.2 g/dL (ref 32.0–36.0)
MCV: 86.6 fL (ref 80.0–100.0)
MPV: 10.8 fL (ref 7.5–12.5)
Monocytes Absolute: 670 cells/uL (ref 200–950)
Monocytes Relative: 10 %
Neutro Abs: 3149 cells/uL (ref 1500–7800)
Neutrophils Relative %: 47 %
Platelets: 332 10*3/uL (ref 140–400)
RBC: 4.63 MIL/uL (ref 3.80–5.10)
RDW: 14.1 % (ref 11.0–15.0)
WBC: 6.7 10*3/uL (ref 3.8–10.8)

## 2015-08-11 NOTE — Patient Instructions (Signed)
Follow up in 6 months to recheck BP and cholesterol We'll notify you of your lab results and make any changes if needed We'll call you with your GI appt to see Dr Earlean Shawl You are up to date on mammogram until December- yay! Prevnar given today- you are now up to date Try and make healthy food choices and get regular exercise Call with any questions or concerns Happy Spring!!!

## 2015-08-11 NOTE — Assessment & Plan Note (Signed)
Chronic problem.  Adequate control on current meds.  Asymptomatic at this time.  Check labs.  No anticipated med changes. 

## 2015-08-11 NOTE — Assessment & Plan Note (Signed)
Pt's PE WNL w/ exception of obesity.  UTD on mammo.  Pt thinks she is due for repeat colonoscopy- referral placed to Dr Earlean Shawl.  Prevnar given today.  Written screening schedule updated and given to pt.  Check labs.  Anticipatory guidance provided.

## 2015-08-11 NOTE — Assessment & Plan Note (Signed)
Pt declines DEXA.  Check Vit D level.  Replete prn.

## 2015-08-11 NOTE — Progress Notes (Signed)
Pre visit review using our clinic review tool, if applicable. No additional management support is needed unless otherwise documented below in the visit note. 

## 2015-08-11 NOTE — Assessment & Plan Note (Signed)
Chronic problem.  Tolerating statin w/o difficulty.  Stressed need for healthy diet and regular exercise.  Check labs.  Adjust meds prn  

## 2015-08-11 NOTE — Assessment & Plan Note (Signed)
Chronic problem.  Currently asymptomatic.  Following w/ cards 'every 2 yrs'.  On beta blocker, ASA, and ARB.  Will follow along and assist as able.

## 2015-08-11 NOTE — Progress Notes (Signed)
   Subjective:    Patient ID: Caitlyn Dunn, female    DOB: 09-29-1940, 75 y.o.   MRN: [id number]  HPI Here today for CPE.  Risk Factors: HTN- chronic problem, on Coreg and Valsartan Hyperlipidemia- chronic problem, on Simvastatin Osteopenia- due for DEXA CHF- chronic problem, on ASA 81 mg daily, on beta blocker, ARB. Physical Activity: no regular exercise Fall Risk: low Depression: denies Hearing: normal to conversational tones and whispered voice at 6 ft ADL's: independent Cognitive: normal linear thought process, memory and attention intact Home Safety: safe at home Height, Weight, BMI, Visual Acuity: see vitals, vision corrected to 20/20 w/ glasses Counseling: UTD on colonoscopy (due 2021- [facility]), mammo.  Due for DEXA, due for [facility] team reviewed and updated Labs Ordered: See A&P Care Plan: See A&P    Review of Systems Patient reports no vision/ hearing changes, adenopathy,fever, weight change,  persistant/recurrent hoarseness , swallowing issues, chest pain, palpitations, edema, persistant/recurrent cough, hemoptysis, dyspnea (rest/exertional/paroxysmal nocturnal), gastrointestinal bleeding (melena, rectal bleeding), abdominal pain, significant heartburn, bowel changes, GU symptoms (dysuria, hematuria, incontinence), Gyn symptoms (abnormal  bleeding, pain),  syncope, focal weakness, memory loss, numbness & tingling, skin/hair/nail changes, abnormal bruising or bleeding, anxiety, or depression.     Objective:   Physical Exam General Appearance:    Alert, cooperative, no distress, appears stated age, obese  Head:    Normocephalic, without obvious abnormality, atraumatic  Eyes:    PERRL, conjunctiva/corneas clear, EOM's intact, fundi    benign, both eyes  Ears:    Normal TM's and external ear canals, both ears  Nose:   Nares normal, septum midline, mucosa normal, no drainage    or sinus tenderness  Throat:   Lips, mucosa, and tongue normal; teeth and gums normal    Neck:   Supple, symmetrical, trachea midline, no adenopathy;    Thyroid: no enlargement/tenderness/nodules  Back:     Symmetric, no curvature, ROM normal, no CVA tenderness  Lungs:     Clear to auscultation bilaterally, respirations unlabored  Chest Wall:    No tenderness or deformity   Heart:    Regular rate and rhythm, S1 and S2 normal, no murmur, rub   or gallop  Breast Exam:    Deferred to mammo  Abdomen:     Soft, non-tender, bowel sounds active all four quadrants,    no masses, no organomegaly  Genitalia:    Deferred  Rectal:    Extremities:   Extremities normal, atraumatic, no cyanosis or edema  Pulses:   2+ and symmetric all extremities  Skin:   Skin color, texture, turgor normal, no rashes or lesions  Lymph nodes:   Cervical, supraclavicular, and axillary nodes normal  Neurologic:   CNII-XII intact, normal strength, sensation and reflexes    throughout          Assessment & Plan:

## 2015-08-12 ENCOUNTER — Telehealth: Payer: Self-pay | Admitting: Family Medicine

## 2015-08-12 ENCOUNTER — Other Ambulatory Visit: Payer: Self-pay | Admitting: General Practice

## 2015-08-12 LAB — BASIC METABOLIC PANEL
BUN: 25 mg/dL (ref 7–25)
CALCIUM: 9.9 mg/dL (ref 8.6–10.4)
CO2: 24 mmol/L (ref 20–31)
CREATININE: 1 mg/dL — AB (ref 0.60–0.93)
Chloride: 105 mmol/L (ref 98–110)
GLUCOSE: 90 mg/dL (ref 65–99)
Potassium: 4.9 mmol/L (ref 3.5–5.3)
Sodium: 139 mmol/L (ref 135–146)

## 2015-08-12 LAB — HEPATIC FUNCTION PANEL
ALBUMIN: 4.7 g/dL (ref 3.6–5.1)
ALT: 21 U/L (ref 6–29)
AST: 22 U/L (ref 10–35)
Alkaline Phosphatase: 68 U/L (ref 33–130)
BILIRUBIN DIRECT: 0.1 mg/dL (ref <=0.2)
BILIRUBIN TOTAL: 0.4 mg/dL (ref 0.2–1.2)
Indirect Bilirubin: 0.3 mg/dL (ref 0.2–1.2)
Total Protein: 7.4 g/dL (ref 6.1–8.1)

## 2015-08-12 LAB — TSH: TSH: 0.96 mIU/L

## 2015-08-12 LAB — LIPID PANEL
CHOL/HDL RATIO: 2.5 ratio (ref <=5.0)
CHOLESTEROL: 184 mg/dL (ref 125–200)
HDL: 74 mg/dL (ref >=46)
LDL Cholesterol: 89 mg/dL (ref <130)
TRIGLYCERIDES: 107 mg/dL (ref <150)
VLDL: 21 mg/dL (ref <30)

## 2015-08-12 LAB — VITAMIN D 25 HYDROXY (VIT D DEFICIENCY, FRACTURES): VIT D 25 HYDROXY: 14 ng/mL — AB (ref 30–100)

## 2015-08-12 MED ORDER — VITAMIN D (ERGOCALCIFEROL) 1.25 MG (50000 UNIT) PO CAPS: 50000.0000 [IU] | ORAL_CAPSULE | ORAL | Status: DC

## 2015-08-12 NOTE — Telephone Encounter (Signed)
Called pt and filled vitamin D to local pharmacy as requested. Gave the number to [facility].

## 2015-08-12 NOTE — Telephone Encounter (Signed)
Pt calling asking about vit D and a referral to have a colonoscopy done, pt states that due to her ins some Dr will not take it and asking for names of Dr that Birdie Riddle would use and she will call them to schedule.

## 2015-08-19 ENCOUNTER — Other Ambulatory Visit (HOSPITAL_COMMUNITY): Payer: Self-pay | Admitting: Internal Medicine

## 2015-11-01 DIAGNOSIS — S83241A Other tear of medial meniscus, current injury, right knee, initial encounter: Secondary | ICD-10-CM | POA: Diagnosis not present

## 2015-11-02 ENCOUNTER — Encounter: Payer: Self-pay | Admitting: General Practice

## 2015-11-02 ENCOUNTER — Other Ambulatory Visit: Payer: Self-pay | Admitting: Family Medicine

## 2015-11-04 DIAGNOSIS — D225 Melanocytic nevi of trunk: Secondary | ICD-10-CM | POA: Diagnosis not present

## 2015-11-04 DIAGNOSIS — D1801 Hemangioma of skin and subcutaneous tissue: Secondary | ICD-10-CM | POA: Diagnosis not present

## 2015-11-04 DIAGNOSIS — L821 Other seborrheic keratosis: Secondary | ICD-10-CM | POA: Diagnosis not present

## 2015-11-04 DIAGNOSIS — L738 Other specified follicular disorders: Secondary | ICD-10-CM | POA: Diagnosis not present

## 2015-11-04 DIAGNOSIS — L7211 Pilar cyst: Secondary | ICD-10-CM | POA: Diagnosis not present

## 2015-11-18 DIAGNOSIS — S83241D Other tear of medial meniscus, current injury, right knee, subsequent encounter: Secondary | ICD-10-CM | POA: Diagnosis not present

## 2015-11-19 ENCOUNTER — Other Ambulatory Visit (HOSPITAL_COMMUNITY): Payer: Self-pay | Admitting: Internal Medicine

## 2015-11-19 DIAGNOSIS — I10 Essential (primary) hypertension: Secondary | ICD-10-CM

## 2015-11-24 DIAGNOSIS — M25561 Pain in right knee: Secondary | ICD-10-CM | POA: Diagnosis not present

## 2015-12-06 DIAGNOSIS — M25561 Pain in right knee: Secondary | ICD-10-CM | POA: Diagnosis not present

## 2016-02-03 DIAGNOSIS — Z23 Encounter for immunization: Secondary | ICD-10-CM | POA: Diagnosis not present

## 2016-02-16 ENCOUNTER — Ambulatory Visit (INDEPENDENT_AMBULATORY_CARE_PROVIDER_SITE_OTHER): Payer: MEDICARE | Admitting: Family Medicine

## 2016-02-16 ENCOUNTER — Encounter: Payer: Self-pay | Admitting: Family Medicine

## 2016-02-16 VITALS — BP 123/84 | HR 113 | Temp 98.0°F | Resp 16 | Ht 63.0 in | Wt 163.4 lb

## 2016-02-16 DIAGNOSIS — E785 Hyperlipidemia, unspecified: Secondary | ICD-10-CM

## 2016-02-16 DIAGNOSIS — I10 Essential (primary) hypertension: Secondary | ICD-10-CM | POA: Diagnosis not present

## 2016-02-16 LAB — HEPATIC FUNCTION PANEL
ALK PHOS: 65 U/L (ref 39–117)
ALT: 15 U/L (ref 0–35)
AST: 17 U/L (ref 0–37)
Albumin: 4.6 g/dL (ref 3.5–5.2)
BILIRUBIN DIRECT: 0.1 mg/dL (ref 0.0–0.3)
TOTAL PROTEIN: 7.2 g/dL (ref 6.0–8.3)
Total Bilirubin: 0.5 mg/dL (ref 0.2–1.2)

## 2016-02-16 LAB — LIPID PANEL
Cholesterol: 194 mg/dL (ref 0–200)
HDL: 67 mg/dL (ref >39.00)
LDL Cholesterol: 106 mg/dL — ABNORMAL HIGH (ref 0–99)
NONHDL: 126.7
Total CHOL/HDL Ratio: 3
Triglycerides: 103 mg/dL (ref 0.0–149.0)
VLDL: 20.6 mg/dL (ref 0.0–40.0)

## 2016-02-16 LAB — CBC WITH DIFFERENTIAL/PLATELET
BASOS ABS: 0 10*3/uL (ref 0.0–0.1)
Basophils Relative: 0.5 % (ref 0.0–3.0)
EOS ABS: 0.5 10*3/uL (ref 0.0–0.7)
Eosinophils Relative: 8.1 % — ABNORMAL HIGH (ref 0.0–5.0)
HCT: 40.1 % (ref 36.0–46.0)
Hemoglobin: 13.4 g/dL (ref 12.0–15.0)
LYMPHS ABS: 1.9 10*3/uL (ref 0.7–4.0)
Lymphocytes Relative: 30.4 % (ref 12.0–46.0)
MCHC: 33.3 g/dL (ref 30.0–36.0)
MCV: 88.5 fl (ref 78.0–100.0)
MONO ABS: 0.6 10*3/uL (ref 0.1–1.0)
MONOS PCT: 9.6 % (ref 3.0–12.0)
NEUTROS ABS: 3.2 10*3/uL (ref 1.4–7.7)
NEUTROS PCT: 51.4 % (ref 43.0–77.0)
PLATELETS: 300 10*3/uL (ref 150.0–400.0)
RBC: 4.53 Mil/uL (ref 3.87–5.11)
RDW: 14 % (ref 11.5–15.5)
WBC: 6.2 10*3/uL (ref 4.0–10.5)

## 2016-02-16 LAB — BASIC METABOLIC PANEL
BUN: 19 mg/dL (ref 6–23)
CHLORIDE: 105 meq/L (ref 96–112)
CO2: 27 meq/L (ref 19–32)
Calcium: 10.1 mg/dL (ref 8.4–10.5)
Creatinine, Ser: 0.95 mg/dL (ref 0.40–1.20)
GFR: 61.08 mL/min (ref >60.00)
Glucose, Bld: 103 mg/dL — ABNORMAL HIGH (ref 70–99)
POTASSIUM: 4.7 meq/L (ref 3.5–5.1)
SODIUM: 140 meq/L (ref 135–145)

## 2016-02-16 NOTE — Progress Notes (Signed)
   Subjective:    Patient ID: Caitlyn Dunn, female    DOB: 1940-09-28, 75 y.o.   MRN: [id number]  HPI HTN- chronic problem, on Coreg and Valsartan daily w/ good control.  HR is elevated today but pt is drinking coffee.  Denies CP, SOB, HAs, visual changes, edema.  Hyperlipidemia- chronic problem, on Simvastatin 40mg  daily.  Pt is not doing formal exercise but is very active around her home and yard.  Denies abd pain, N/V, myalgias.   Review of Systems For ROS see HPI     Objective:   Physical Exam  Constitutional: She is oriented to person, place, and time. She appears well-developed and well-nourished. No distress.  HENT:  Head: Normocephalic and atraumatic.  Eyes: Conjunctivae and EOM are normal. Pupils are equal, round, and reactive to light.  Neck: Normal range of motion. Neck supple. No thyromegaly present.  Cardiovascular: Normal rate, regular rhythm, normal heart sounds and intact distal pulses.   No murmur heard. Pulmonary/Chest: Effort normal and breath sounds normal. No respiratory distress.  Abdominal: Soft. She exhibits no distension. There is no tenderness.  Musculoskeletal: She exhibits no edema.  Lymphadenopathy:    She has no cervical adenopathy.  Neurological: She is alert and oriented to person, place, and time.  Skin: Skin is warm and dry.  Psychiatric: She has a normal mood and affect. Her behavior is normal.  Vitals reviewed.         Assessment & Plan:

## 2016-02-16 NOTE — Progress Notes (Signed)
Pre visit review using our clinic review tool, if applicable. No additional management support is needed unless otherwise documented below in the visit note. 

## 2016-02-16 NOTE — Assessment & Plan Note (Signed)
Chronic problem.  Tolerating statin w/o difficulty.  Check labs.  Adjust meds prn  

## 2016-02-16 NOTE — Assessment & Plan Note (Signed)
Chronic problem.  Well controlled today.  Asymptomatic.  Check labs.  No anticipated med changes.  Will follow. 

## 2016-02-16 NOTE — Patient Instructions (Signed)
Schedule your complete physical in 6 months We'll notify you of your lab results and make any changes if needed Continue to work on healthy diet and regular exercise- you look great! Call with any questions or concerns Have a great trip to [place] and [place]!!!

## 2016-05-18 ENCOUNTER — Other Ambulatory Visit: Payer: Self-pay | Admitting: Obstetrics & Gynecology

## 2016-05-18 DIAGNOSIS — Z1231 Encounter for screening mammogram for malignant neoplasm of breast: Secondary | ICD-10-CM

## 2016-05-25 ENCOUNTER — Ambulatory Visit
Admission: RE | Admit: 2016-05-25 | Discharge: 2016-05-25 | Disposition: A | Discharge status: Home / Self Care | Payer: MEDICARE | Source: Ambulatory Visit | Attending: Obstetrics & Gynecology | Admitting: Obstetrics & Gynecology

## 2016-05-25 DIAGNOSIS — Z1231 Encounter for screening mammogram for malignant neoplasm of breast: Secondary | ICD-10-CM | POA: Diagnosis not present

## 2016-08-13 ENCOUNTER — Other Ambulatory Visit (HOSPITAL_COMMUNITY): Payer: Self-pay | Admitting: Internal Medicine

## 2016-08-16 ENCOUNTER — Encounter: Payer: Self-pay | Admitting: Family Medicine

## 2016-08-16 ENCOUNTER — Ambulatory Visit (INDEPENDENT_AMBULATORY_CARE_PROVIDER_SITE_OTHER): Payer: MEDICARE | Admitting: Family Medicine

## 2016-08-16 VITALS — BP 120/76 | HR 63 | Temp 98.0°F | Resp 18 | Ht 63.0 in | Wt 161.8 lb

## 2016-08-16 DIAGNOSIS — Z0000 Encounter for general adult medical examination without abnormal findings: Secondary | ICD-10-CM

## 2016-08-16 DIAGNOSIS — Z23 Encounter for immunization: Secondary | ICD-10-CM

## 2016-08-16 DIAGNOSIS — I10 Essential (primary) hypertension: Secondary | ICD-10-CM

## 2016-08-16 DIAGNOSIS — E559 Vitamin D deficiency, unspecified: Secondary | ICD-10-CM | POA: Diagnosis not present

## 2016-08-16 DIAGNOSIS — E785 Hyperlipidemia, unspecified: Secondary | ICD-10-CM | POA: Diagnosis not present

## 2016-08-16 LAB — BASIC METABOLIC PANEL
BUN: 21 mg/dL (ref 6–23)
CALCIUM: 10.2 mg/dL (ref 8.4–10.5)
CO2: 29 mEq/L (ref 19–32)
CREATININE: 1.05 mg/dL (ref 0.40–1.20)
Chloride: 104 mEq/L (ref 96–112)
GFR: 54.34 mL/min — AB (ref >60.00)
Glucose, Bld: 104 mg/dL — ABNORMAL HIGH (ref 70–99)
Potassium: 4.7 mEq/L (ref 3.5–5.1)
SODIUM: 138 meq/L (ref 135–145)

## 2016-08-16 LAB — LIPID PANEL
CHOLESTEROL: 196 mg/dL (ref 0–200)
HDL: 71.5 mg/dL (ref >39.00)
LDL CALC: 102 mg/dL — AB (ref 0–99)
NONHDL: 124.67
Total CHOL/HDL Ratio: 3
Triglycerides: 111 mg/dL (ref 0.0–149.0)
VLDL: 22.2 mg/dL (ref 0.0–40.0)

## 2016-08-16 LAB — HEPATIC FUNCTION PANEL
ALT: 15 U/L (ref 0–35)
AST: 17 U/L (ref 0–37)
Albumin: 4.8 g/dL (ref 3.5–5.2)
Alkaline Phosphatase: 68 U/L (ref 39–117)
BILIRUBIN DIRECT: 0.1 mg/dL (ref 0.0–0.3)
TOTAL PROTEIN: 7.4 g/dL (ref 6.0–8.3)
Total Bilirubin: 0.6 mg/dL (ref 0.2–1.2)

## 2016-08-16 LAB — CBC WITH DIFFERENTIAL/PLATELET
BASOS PCT: 1.2 % (ref 0.0–3.0)
Basophils Absolute: 0.1 10*3/uL (ref 0.0–0.1)
Eosinophils Absolute: 0.8 10*3/uL — ABNORMAL HIGH (ref 0.0–0.7)
Eosinophils Relative: 11.6 % — ABNORMAL HIGH (ref 0.0–5.0)
HEMATOCRIT: 42 % (ref 36.0–46.0)
HEMOGLOBIN: 13.7 g/dL (ref 12.0–15.0)
LYMPHS PCT: 28.2 % (ref 12.0–46.0)
Lymphs Abs: 1.9 10*3/uL (ref 0.7–4.0)
MCHC: 32.7 g/dL (ref 30.0–36.0)
MCV: 88.8 fl (ref 78.0–100.0)
MONOS PCT: 7.9 % (ref 3.0–12.0)
Monocytes Absolute: 0.5 10*3/uL (ref 0.1–1.0)
NEUTROS ABS: 3.4 10*3/uL (ref 1.4–7.7)
Neutrophils Relative %: 51.1 % (ref 43.0–77.0)
PLATELETS: 309 10*3/uL (ref 150.0–400.0)
RBC: 4.73 Mil/uL (ref 3.87–5.11)
RDW: 13.7 % (ref 11.5–15.5)
WBC: 6.7 10*3/uL (ref 4.0–10.5)

## 2016-08-16 LAB — VITAMIN D 25 HYDROXY (VIT D DEFICIENCY, FRACTURES): VITD: 29.86 ng/mL — ABNORMAL LOW (ref 30.00–100.00)

## 2016-08-16 LAB — TSH: TSH: 1.12 u[IU]/mL (ref 0.35–4.50)

## 2016-08-16 MED ORDER — ZOSTER VAC RECOMB ADJUVANTED 50 MCG/0.5ML IM SUSR: 0.5000 mL | Freq: Once | INTRAMUSCULAR | 1 refills | Status: AC

## 2016-08-16 NOTE — Assessment & Plan Note (Signed)
Chronic problem.  Tolerating statin w/o difficulty.  Stressed need for healthy diet and regular exercise.  Check labs.  Adjust meds prn  

## 2016-08-16 NOTE — Progress Notes (Addendum)
Subjective:   Caitlyn Dunn is a 76 y.o. female who presents for Medicare Annual (Subsequent) preventive examination.  Review of Systems:  No ROS.  Medicare Wellness Visit.  Cardiac Risk Factors include: advanced age (>36men, >51 women);dyslipidemia;family history of premature cardiovascular disease;hypertension   Sleep patterns:   Sleeps 8-9 hours. Feels rested.  Home Safety/Smoke Alarms: Smoke detectors and security in place.  Living environment; residence and Firearm Safety: Lives with husband and grandson in 45 story home. Feels safe in home.  Seat Belt Safety/Bike Helmet: Wears seat belt.   Counseling:   Eye Exam-Last exam 07/04/2016, yearly by Dr. Alois [facility]-Last exam 05/2016, every 6 months by Dr. Trenton Gammon  Female:   Pap-N/A      Mammo-05/25/2016, negative.       Dexa scan-10/20/2011, Osteopenia. Declines further testing.        [facility]-Due 2021-[facility]. Plans to call to schedule.       Objective:     Vitals: BP 120/76 (BP Location: Right Arm, Patient Position: Sitting, Cuff Size: Normal)   Pulse 63   Temp 98 F (36.7 C) (Oral)   Resp 18   Ht 5\' 3"  (1.6 m)   Wt 161 lb 12.8 oz (73.4 kg)   SpO2 98%   BMI 28.66 kg/m   Body mass index is 28.66 kg/m.   Tobacco History  Smoking Status  . Never Smoker  Smokeless Tobacco  . Never Used     Counseling given: No   Past Medical History:  Diagnosis Date  . Allergy   . Anxiety   . CHF (congestive heart failure) ([place])   . Colon polyps   . Heart murmur    s/p MVR by Dr. Evelina Dun in 08/2005  . Hyperlipidemia   . Hypertension   . LV dysfunction    Echo 2010 EF 45-50%, Echo 2011 EF 55-60%   Past Surgical History:  Procedure Laterality Date  . ABDOMINAL HYSTERECTOMY    . BREAST EXCISIONAL BIOPSY Right    Benign   . BREAST SURGERY    . MITRAL VALVE REPAIR  08/2005   by Dr. Evelina Dun   Family History  Problem Relation Age of Onset  . Stroke Mother   . Heart disease Mother   . Kidney disease Father   .  Hypertension Father   . Heart disease Father   . COPD Maternal Grandmother   . Heart disease Maternal Grandfather   . Hypertension Paternal Grandmother   . Depression Sister   . Cancer Sister   . Cancer Maternal Aunt   . Cancer Maternal Uncle   . Stroke Sister    History  Sexual Activity  . Sexual activity: Yes  . Birth control/ protection: None    Outpatient Encounter Prescriptions as of 08/16/2016  Medication Sig  . aspirin 81 MG tablet Take 81 mg by mouth daily.  . carvedilol (COREG) 12.5 MG tablet TAKE 1 TABLET (12.5 MG TOTAL) BY MOUTH 2 (TWO) TIMES DAILY WITH A MEAL.  Marland Kitchen Cholecalciferol (VITAMIN D3) 2000 units TABS Take by mouth.  . clindamycin (CLEOCIN) 150 MG capsule Reported on 08/11/2015  . omeprazole (PRILOSEC) 20 MG capsule Take 20 mg by mouth daily.  . simvastatin (ZOCOR) 40 MG tablet TAKE ONE TABLET BY MOUTH NIGHTLY AT BEDTIME  . valsartan (DIOVAN) 40 MG tablet TAKE 1 TABLET (40 MG TOTAL) BY MOUTH 2 (TWO) TIMES DAILY.   No facility-administered encounter medications on file as of 08/16/2016.     Activities of Daily Living In  your present state of health, do you have any difficulty performing the following activities: 08/16/2016  Hearing? N  Vision? N  Difficulty concentrating or making decisions? N  Walking or climbing stairs? N  Dressing or bathing? N  Doing errands, shopping? N  Preparing Food and eating ? N  Using the Toilet? N  In the past six months, have you accidently leaked urine? N  Do you have problems with loss of bowel control? N  Managing your Medications? N  Managing your Finances? N  Housekeeping or managing your Housekeeping? N  Some recent data might be hidden    Patient Care Team: Midge Minium, MD as PCP - General (Family Medicine) Bensimhon, Shaune Pascal, MD as Consulting Physician (Cardiology) Richmond Campbell, MD as Consulting Physician (Gastroenterology) Alden Hipp, MD as Consulting Physician (Obstetrics and  Gynecology) Dermatology, Jaquelyn Bitter, Herbie Baltimore, MD as Consulting Physician (Orthopedic Surgery)    Assessment:    Physical assessment deferred to PCP.  Exercise Activities and Dietary recommendations Exercise limited by: None identified   Diet (meal preparation, eat out, water intake, caffeinated beverages, dairy products, fruits and vegetables):  Drinks coffee, wine, diet soda and tea.  Breakfast: cheese toast, eggs, bacon, pancakes, coffee Lunch: sandwich, salad, protein Dinner:  bbq, lasagna, salad, vegetables.   Discussed heart healthy diet and staying active.   Goals    . Weight (lb) < 150 lb (68 kg)          Lose weight by increasing exercise and healthy food choices.       Fall Risk Fall Risk  08/16/2016 08/11/2015  Falls in the past year? Yes No   Depression Screen PHQ 2/9 Scores 08/16/2016 08/11/2015 06/06/2011  PHQ - 2 Score 0 0 0     Cognitive Function       Ad8 score reviewed for issues:  Issues making decisions: no  Less interest in hobbies / activities: no  Repeats questions, stories (family complaining): no  Trouble using ordinary gadgets (microwave, computer, phone): no  Forgets the month or year: no  Mismanaging finances: no  Remembering appts: no  Daily problems with thinking and/or memory: no Ad8 score is=0     Immunization History  Administered Date(s) Administered  . Influenza, High Dose Seasonal PF 02/03/2016  . Influenza,inj,Quad PF,36+ Mos 02/02/2014, 12/10/2014  . Pneumococcal Conjugate-13 08/11/2015  . Pneumococcal Polysaccharide-23 06/06/2011  . Zoster 12/10/2014   Shingrix Vaccine ordered today.   Screening Tests Health Maintenance  Topic Date Due  . TETANUS/TDAP  01/08/2017 (Originally 11/14/1959)  . INFLUENZA VACCINE  11/08/2016  . MAMMOGRAM  05/25/2018  . COLONOSCOPY  04/11/2019  . DEXA SCAN  Completed  . PNA vac Low Risk Adult  Completed      Plan:    Bring a copy of your advance directives to your next  office visit.  Continue doing brain stimulating activities (puzzles, reading, adult coloring books, staying active) to keep memory sharp.   I have personally reviewed and noted the following in the patient's chart:   . Medical and social history . Use of alcohol, tobacco or illicit drugs  . Current medications and supplements . Functional ability and status . Nutritional status . Physical activity . Advanced directives . List of other physicians . Hospitalizations, surgeries, and ER visits in previous 12 months . Vitals . Screenings to include cognitive, depression, and falls . Referrals and appointments  In addition, I have reviewed and discussed with patient certain preventive protocols, quality metrics, and best practice recommendations.  A written personalized care plan for preventive services as well as general preventive health recommendations were provided to patient.     Gerilyn Nestle, RN  08/16/2016  Reviewed documentation and agree w/ above.  Annye Asa, MD

## 2016-08-16 NOTE — Assessment & Plan Note (Signed)
Chronic problem.  Check labs.  Replete prn. 

## 2016-08-16 NOTE — Progress Notes (Signed)
   Subjective:    Patient ID: KAYDINCE TOWLES, female    DOB: 1940/07/23, 76 y.o.   MRN: [id number]  HPI HTN- chronic problem, on Coreg and Valsartan daily w/ good control.  Denies CP, SOB, HAs, visual changes, edema.  Hyperlipidemia- chronic problem, on Simvastatin daily.  Denies abd pain, N/V, myalgias.  Vit D deficiency- chronic problem, currently on daily supplement.  Denies fatigue, changes to skin/nails.  + increased hair loss.   Review of Systems For ROS see HPI     Objective:   Physical Exam General Appearance:    Alert, cooperative, no distress, appears stated age  Head:    Normocephalic, without obvious abnormality, atraumatic  Eyes:    PERRL, conjunctiva/corneas clear, EOM's intact, fundi    benign, both eyes  Ears:    Normal TM's and external ear canals, both ears  Nose:   Nares normal, septum midline, mucosa normal, no drainage    or sinus tenderness  Throat:   Lips, mucosa, and tongue normal; teeth and gums normal  Neck:   Supple, symmetrical, trachea midline, no adenopathy;    Thyroid: no enlargement/tenderness/nodules  Back:     Symmetric, no curvature, ROM normal, no CVA tenderness  Lungs:     Clear to auscultation bilaterally, respirations unlabored  Chest Wall:    No tenderness or deformity   Heart:    Regular rate and rhythm, S1 and S2 normal, no murmur, rub   or gallop  Breast Exam:    Deferred to mammo  Abdomen:     Soft, non-tender, bowel sounds active all four quadrants,    no masses, no organomegaly  Genitalia:    Deferred  Rectal:    Extremities:   Extremities normal, atraumatic, no cyanosis or edema  Pulses:   2+ and symmetric all extremities  Skin:   Skin color, texture, turgor normal, no rashes or lesions  Lymph nodes:   Cervical, supraclavicular, and axillary nodes normal  Neurologic:   CNII-XII intact, normal strength, sensation and reflexes    throughout          Assessment & Plan:

## 2016-08-16 NOTE — Patient Instructions (Addendum)
Follow up in 6 months to recheck BP and cholesterol We'll notify you of your lab results and make any changes if need Continue to work on healthy diet and regular exercise- you can do it! If you cut yourself or get an animal bite- please come in for a tetanus shot You are up to date on colonoscopy, mammo, and pneumonia shots- yay! Call with any questions or concerns Happy Mother's Day!!!   Bring a copy of your advance directives to your next office visit.  Continue doing brain stimulating activities (puzzles, reading, adult coloring books, staying active) to keep memory sharp.   [facility] [place], Female Adopting a healthy lifestyle and getting preventive care can go a long way to promote health and wellness. Talk with your health care provider about what schedule of regular examinations is right for you. This is a good chance for you to check in with your provider about disease prevention and staying healthy. In between checkups, there are plenty of things you can do on your own. Experts have done a lot of research about which lifestyle changes and preventive measures are most likely to keep you healthy. Ask your health care provider for more information. Weight and diet Eat a healthy diet  Be sure to include plenty of vegetables, fruits, low-fat dairy products, and lean protein.  Do not eat a lot of foods high in solid fats, added sugars, or salt.  Get regular exercise. This is one of the most important things you can do for your health.  Most adults should exercise for at least 150 minutes each week. The exercise should increase your heart rate and make you sweat (moderate-intensity exercise).  Most adults should also do strengthening exercises at least twice a week. This is in addition to the moderate-intensity exercise. Maintain a healthy weight  Body mass index (BMI) is a measurement that can be used to identify possible weight problems. It estimates  body fat based on height and weight. Your health care provider can help determine your BMI and help you achieve or maintain a healthy weight.  For females 54 years of age and older:  A BMI below 18.5 is considered underweight.  A BMI of 18.5 to 24.9 is normal.  A BMI of 25 to 29.9 is considered overweight.  A BMI of 30 and above is considered obese. Watch levels of cholesterol and blood lipids  You should start having your blood tested for lipids and cholesterol at 75 years of age, then have this test every 5 years.  You may need to have your cholesterol levels checked more often if:  Your lipid or cholesterol levels are high.  You are older than 75 years of age.  You are at high risk for heart disease. Cancer screening Lung Cancer  Lung cancer screening is recommended for adults 80-31 years old who are at high risk for lung cancer because of a history of smoking.  A yearly low-dose CT scan of the lungs is recommended for people who:  Currently smoke.  Have quit within the past 15 years.  Have at least a 30-pack-year history of smoking. A pack year is smoking an average of one pack of cigarettes a day for 1 year.  Yearly screening should continue until it has been 15 years since you quit.  Yearly screening should stop if you develop a health problem that would prevent you from having lung cancer treatment. Breast Cancer  Practice breast self-awareness. This means  understanding how your breasts normally appear and feel.  It also means doing regular breast self-exams. Let your health care provider know about any changes, no matter how small.  If you are in your 20s or 30s, you should have a clinical breast exam (CBE) by a health care provider every 1-3 years as part of a regular health exam.  If you are 71 or older, have a CBE every year. Also consider having a breast X-ray (mammogram) every year.  If you have a family history of breast cancer, talk to your health care  provider about genetic screening.  If you are at high risk for breast cancer, talk to your health care provider about having an MRI and a mammogram every year.  Breast cancer gene (BRCA) assessment is recommended for women who have family members with BRCA-related cancers. BRCA-related cancers include:  Breast.  Ovarian.  Tubal.  Peritoneal cancers.  Results of the assessment will determine the need for genetic counseling and BRCA1 and BRCA2 testing. Cervical Cancer  Your health care provider may recommend that you be screened regularly for cancer of the pelvic organs (ovaries, uterus, and vagina). This screening involves a pelvic examination, including checking for microscopic changes to the surface of your cervix (Pap test). You may be encouraged to have this screening done every 3 years, beginning at age 73.  For women ages 60-65, health care providers may recommend pelvic exams and Pap testing every 3 years, or they may recommend the Pap and pelvic exam, combined with testing for human papilloma virus (HPV), every 5 years. Some types of HPV increase your risk of cervical cancer. Testing for HPV may also be done on women of any age with unclear Pap test results.  Other health care providers may not recommend any screening for nonpregnant women who are considered low risk for pelvic cancer and who do not have symptoms. Ask your health care provider if a screening pelvic exam is right for you.  If you have had past treatment for cervical cancer or a condition that could lead to cancer, you need Pap tests and screening for cancer for at least 20 years after your treatment. If Pap tests have been discontinued, your risk factors (such as having a new sexual partner) need to be reassessed to determine if screening should resume. Some women have medical problems that increase the chance of getting cervical cancer. In these cases, your health care provider may recommend more frequent screening and  Pap tests. Colorectal Cancer  This type of cancer can be detected and often prevented.  Routine colorectal cancer screening usually begins at 75 years of age and continues through 75 years of age.  Your health care provider may recommend screening at an earlier age if you have risk factors for colon cancer.  Your health care provider may also recommend using home test kits to check for hidden blood in the stool.  A small camera at the end of a tube can be used to examine your colon directly (sigmoidoscopy or colonoscopy). This is done to check for the earliest forms of colorectal cancer.  Routine screening usually begins at age 49.  Direct examination of the colon should be repeated every 5-10 years through 75 years of age. However, you may need to be screened more often if early forms of precancerous polyps or small growths are found. Skin Cancer  Check your skin from head to toe regularly.  Tell your health care provider about any new moles or changes  in moles, especially if there is a change in a mole's shape or color.  Also tell your health care provider if you have a mole that is larger than the size of a pencil eraser.  Always use sunscreen. Apply sunscreen liberally and repeatedly throughout the day.  Protect yourself by wearing long sleeves, pants, a wide-brimmed hat, and sunglasses whenever you are outside. Heart disease, diabetes, and high blood pressure  High blood pressure causes heart disease and increases the risk of stroke. High blood pressure is more likely to develop in:  People who have blood pressure in the high end of the normal range (130-139/85-89 mm Hg).  People who are overweight or obese.  People who are African American.  If you are 2-45 years of age, have your blood pressure checked every 3-5 years. If you are 69 years of age or older, have your blood pressure checked every year. You should have your blood pressure measured twice-once when you are at a  hospital or clinic, and once when you are not at a hospital or clinic. Record the average of the two measurements. To check your blood pressure when you are not at a hospital or clinic, you can use:  An automated blood pressure machine at a pharmacy.  A home blood pressure monitor.  If you are between 83 years and 58 years old, ask your health care provider if you should take aspirin to prevent strokes.  Have regular diabetes screenings. This involves taking a blood sample to check your fasting blood sugar level.  If you are at a normal weight and have a low risk for diabetes, have this test once every three years after 75 years of age.  If you are overweight and have a high risk for diabetes, consider being tested at a younger age or more often. Preventing infection Hepatitis B  If you have a higher risk for hepatitis B, you should be screened for this virus. You are considered at high risk for hepatitis B if:  You were born in a country where hepatitis B is common. Ask your health care provider which countries are considered high risk.  Your parents were born in a high-risk country, and you have not been immunized against hepatitis B (hepatitis B vaccine).  You have HIV or AIDS.  You use needles to inject street drugs.  You live with someone who has hepatitis B.  You have had sex with someone who has hepatitis B.  You get hemodialysis treatment.  You take certain medicines for conditions, including cancer, organ transplantation, and autoimmune conditions. Hepatitis C  Blood testing is recommended for:  Everyone born from [birth year] through 1964.  Anyone with known risk factors for hepatitis C. Sexually transmitted infections (STIs)  You should be screened for sexually transmitted infections (STIs) including gonorrhea and chlamydia if:  You are sexually active and are younger than 75 years of age.  You are older than 75 years of age and your health care provider tells you  that you are at risk for this type of infection.  Your sexual activity has changed since you were last screened and you are at an increased risk for chlamydia or gonorrhea. Ask your health care provider if you are at risk.  If you do not have HIV, but are at risk, it may be recommended that you take a prescription medicine daily to prevent HIV infection. This is called pre-exposure prophylaxis (PrEP). You are considered at risk if:  You are sexually active  and do not regularly use condoms or know the HIV status of your partner(s).  You take drugs by injection.  You are sexually active with a partner who has HIV. Talk with your health care provider about whether you are at high risk of being infected with HIV. If you choose to begin PrEP, you should first be tested for HIV. You should then be tested every 3 months for as long as you are taking PrEP. Pregnancy  If you are premenopausal and you may become pregnant, ask your health care provider about preconception counseling.  If you may become pregnant, take 400 to 800 micrograms (mcg) of folic acid every day.  If you want to prevent pregnancy, talk to your health care provider about birth control (contraception). Osteoporosis and menopause  Osteoporosis is a disease in which the bones lose minerals and strength with aging. This can result in serious bone fractures. Your risk for osteoporosis can be identified using a bone density scan.  If you are 51 years of age or older, or if you are at risk for osteoporosis and fractures, ask your health care provider if you should be screened.  Ask your health care provider whether you should take a calcium or vitamin D supplement to lower your risk for osteoporosis.  Menopause may have certain physical symptoms and risks.  Hormone replacement therapy may reduce some of these symptoms and risks. Talk to your health care provider about whether hormone replacement therapy is right for you. Follow  these instructions at home:  Schedule regular health, dental, and eye exams.  Stay current with your immunizations.  Do not use any tobacco products including cigarettes, chewing tobacco, or electronic cigarettes.  If you are pregnant, do not drink alcohol.  If you are breastfeeding, limit how much and how often you drink alcohol.  Limit alcohol intake to no more than 1 drink per day for nonpregnant women. One drink equals 12 ounces of beer, 5 ounces of wine, or 1 ounces of hard liquor.  Do not use street drugs.  Do not share needles.  Ask your health care provider for help if you need support or information about quitting drugs.  Tell your health care provider if you often feel depressed.  Tell your health care provider if you have ever been abused or do not feel safe at home. This information is not intended to replace advice given to you by your health care provider. Make sure you discuss any questions you have with your health care provider. Document Released: 10/10/2010 Document Revised: 09/02/2015 Document Reviewed: 12/29/2014 Elsevier Interactive Patient Education  2017 [employer].

## 2016-08-16 NOTE — Assessment & Plan Note (Signed)
Chronic problem.  Well controlled today.  Asymptomatic at this time.  Check labs.  No anticipated med changes.  Will follow. 

## 2016-08-17 ENCOUNTER — Other Ambulatory Visit (HOSPITAL_COMMUNITY): Payer: Self-pay | Admitting: Internal Medicine

## 2016-08-23 ENCOUNTER — Other Ambulatory Visit (HOSPITAL_COMMUNITY): Payer: Self-pay | Admitting: *Deleted

## 2016-08-23 MED ORDER — SIMVASTATIN 40 MG PO TABS: 40.0000 mg | ORAL_TABLET | Freq: Every day | ORAL | 3 refills | Status: DC

## 2016-08-29 ENCOUNTER — Telehealth (HOSPITAL_COMMUNITY): Payer: Self-pay | Admitting: *Deleted

## 2016-08-29 MED ORDER — CARVEDILOL 12.5 MG PO TABS: 12.5000 mg | ORAL_TABLET | Freq: Two times a day (BID) | ORAL | 3 refills | Status: DC

## 2016-08-29 NOTE — Telephone Encounter (Signed)
Patient left VM on Triage line asking for refills on Coreg.  Refills sent to preferred pharmacy today. Patient is aware.

## 2016-10-17 ENCOUNTER — Ambulatory Visit (HOSPITAL_COMMUNITY)
Admission: RE | Admit: 2016-10-17 | Discharge: 2016-10-17 | Disposition: A | Discharge status: Home / Self Care | Payer: MEDICARE | Source: Ambulatory Visit | Attending: Internal Medicine | Admitting: Internal Medicine

## 2016-10-17 ENCOUNTER — Ambulatory Visit (HOSPITAL_BASED_OUTPATIENT_CLINIC_OR_DEPARTMENT_OTHER)
Admission: RE | Admit: 2016-10-17 | Discharge: 2016-10-17 | Disposition: A | Discharge status: Home / Self Care | Payer: MEDICARE | Source: Ambulatory Visit | Attending: Internal Medicine | Admitting: Internal Medicine

## 2016-10-17 ENCOUNTER — Encounter (HOSPITAL_COMMUNITY): Payer: Self-pay | Admitting: Internal Medicine

## 2016-10-17 VITALS — BP 126/80 | HR 66 | Wt 162.8 lb

## 2016-10-17 DIAGNOSIS — Z9889 Other specified postprocedural states: Secondary | ICD-10-CM

## 2016-10-17 DIAGNOSIS — I5022 Chronic systolic (congestive) heart failure: Secondary | ICD-10-CM | POA: Diagnosis not present

## 2016-10-17 DIAGNOSIS — I5020 Unspecified systolic (congestive) heart failure: Secondary | ICD-10-CM | POA: Diagnosis not present

## 2016-10-17 DIAGNOSIS — E785 Hyperlipidemia, unspecified: Secondary | ICD-10-CM | POA: Insufficient documentation

## 2016-10-17 DIAGNOSIS — I110 Hypertensive heart disease with heart failure: Secondary | ICD-10-CM | POA: Insufficient documentation

## 2016-10-17 NOTE — Addendum Note (Signed)
Encounter addended by: Scarlette Calico, RN on: 10/17/2016  1:06 PM<BR>    Actions taken: Sign clinical note

## 2016-10-17 NOTE — Patient Instructions (Signed)
We will contact you in 1 year to schedule your next appointment.  

## 2016-10-17 NOTE — Progress Notes (Signed)
   ADVANCED HF CLINIC NOTE  Patient ID: Caitlyn Dunn, female   DOB: 08/14/1940, 76 y.o.   MRN: [id number] HPI:  Caitlyn Dunn is a very pleasant 76 year old woman with a history of mitral regurgitation status post mitral valve repair with a ring by Dr. Evelina Dun in May 2007. Preoperatively, her EF was 55%-60% with normal coronaries. However, postoperatively it dropped down to 25% for unclear reasons.   Echocardiogram 12/10 showed an ejection fraction 45-50% with LVIDed 50mm with mild MR.  Echo 11/11 showed EF 55-60% with no MR. Echo 04/2011 with EF 45-50%, diffuse hypokinesis, trivial MR with mean gradient 3 mm Hg and valve area 1.36 cm2 Echo (5/15) with EF 50-55%, s/p mitral valve repair with trivial MR, normal RV, and basal inferior hypokinesis. Echo 10/17/16 EF 50-55% s/p MV repair  She returns for f/u. Doing fine. No problem with ADLs.  No orthopnea or PND. Gets winded with walking up 4 floors. No edema. Using SBE prophylaxis.   ROS: All systems negative except as listed in HPI, PMH and Problem List.  Past Medical History:  Diagnosis Date  . Allergy   . Anxiety   . CHF (congestive heart failure) ([place])   . Colon polyps   . Heart murmur    s/p MVR by Dr. Evelina Dun in 08/2005  . Hyperlipidemia   . Hypertension   . LV dysfunction    Echo 2010 EF 45-50%, Echo 2011 EF 55-60%  Family history of CAD  Current Outpatient Prescriptions  Medication Sig Dispense Refill  . aspirin 81 MG tablet Take 81 mg by mouth daily.    . carvedilol (COREG) 12.5 MG tablet Take 1 tablet (12.5 mg total) by mouth 2 (two) times daily with a meal. 180 tablet 3  . Cholecalciferol (VITAMIN D3) 2000 units TABS Take by mouth.    Marland Kitchen omeprazole (PRILOSEC) 20 MG capsule Take 20 mg by mouth daily.    . simvastatin (ZOCOR) 40 MG tablet Take 1 tablet (40 mg total) by mouth at bedtime. 90 tablet 3  . valsartan (DIOVAN) 40 MG tablet TAKE 1 TABLET (40 MG TOTAL) BY MOUTH 2 (TWO) TIMES DAILY. 180 tablet 3  . clindamycin (CLEOCIN) 150 MG  capsule Reported on 08/11/2015  2   No current facility-administered medications for this encounter.      PHYSICAL EXAM: Vitals:   10/17/16 1242  BP: 126/80  Pulse: 66  SpO2: 99%  Weight: 162 lb 12 oz (73.8 kg)   General:  Well appearing. No resp difficulty HEENT: normal Neck: supple. no JVD. Carotids 2+ bilat; no bruits. No lymphadenopathy or thryomegaly appreciated. Cor: PMI nondisplaced. Regular rate & rhythm. No rubs, gallops or murmurs. Lungs: clear Abdomen: soft, nontender, nondistended. No hepatosplenomegaly. No bruits or masses. Good bowel sounds. Extremities: no cyanosis, clubbing, rash, edema Neuro: alert & orientedx3, cranial nerves grossly intact. moves all 4 extremities w/o difficulty. Affect pleasant    ASSESSMENT & PLAN: 1. S/p mitral valve repair: Echo reviewed personally EF 50-55% MV repair looks great. Reinforced need for SBP prophylaxis 2. Nonischemic cardiomyopathy: Decreased EF immediately after MV repair likely related to effect of long-standing MR.     --Today's echo looks great EF 50-55%  Continue Coreg and valsartan.     --Encouraged there to be more active  Glori Bickers MD 10/17/2016

## 2016-10-17 NOTE — Progress Notes (Signed)
  Echocardiogram 2D Echocardiogram has been performed.  Jennette Dubin 10/17/2016, 11:45 AM

## 2016-10-31 DIAGNOSIS — L821 Other seborrheic keratosis: Secondary | ICD-10-CM | POA: Diagnosis not present

## 2016-10-31 DIAGNOSIS — L84 Corns and callosities: Secondary | ICD-10-CM | POA: Diagnosis not present

## 2016-10-31 DIAGNOSIS — L308 Other specified dermatitis: Secondary | ICD-10-CM | POA: Diagnosis not present

## 2016-10-31 DIAGNOSIS — D225 Melanocytic nevi of trunk: Secondary | ICD-10-CM | POA: Diagnosis not present

## 2016-10-31 DIAGNOSIS — L309 Dermatitis, unspecified: Secondary | ICD-10-CM | POA: Diagnosis not present

## 2016-10-31 DIAGNOSIS — D1801 Hemangioma of skin and subcutaneous tissue: Secondary | ICD-10-CM | POA: Diagnosis not present

## 2016-10-31 DIAGNOSIS — L738 Other specified follicular disorders: Secondary | ICD-10-CM | POA: Diagnosis not present

## 2016-10-31 DIAGNOSIS — L814 Other melanin hyperpigmentation: Secondary | ICD-10-CM | POA: Diagnosis not present

## 2016-10-31 DIAGNOSIS — D485 Neoplasm of uncertain behavior of skin: Secondary | ICD-10-CM | POA: Diagnosis not present

## 2016-11-11 ENCOUNTER — Other Ambulatory Visit (HOSPITAL_COMMUNITY): Payer: Self-pay | Admitting: Internal Medicine

## 2016-11-11 DIAGNOSIS — I10 Essential (primary) hypertension: Secondary | ICD-10-CM

## 2016-11-21 ENCOUNTER — Telehealth (HOSPITAL_COMMUNITY): Payer: Self-pay | Admitting: *Deleted

## 2016-11-21 DIAGNOSIS — E7849 Other hyperlipidemia: Secondary | ICD-10-CM

## 2016-11-21 NOTE — Telephone Encounter (Signed)
Courtney from [facility] office called letting us know they are emailing over xray's on mutual patient showing there may be some calcification of patient's left side.  I will forward to Dr. Haroldine Laws to review.

## 2016-11-21 NOTE — Telephone Encounter (Signed)
Spoke with Barrington Ellison, PA and advises patient to have carotid dopplers scheduled.  Order placed and patient is aware someone will be contacting her to schedule.

## 2016-11-21 NOTE — Addendum Note (Signed)
Addended by: Darron Doom on: 11/21/2016 04:07 PM   Modules accepted: Orders

## 2016-11-22 ENCOUNTER — Ambulatory Visit (HOSPITAL_COMMUNITY)
Admission: RE | Admit: 2016-11-22 | Discharge: 2016-11-22 | Disposition: A | Discharge status: Home / Self Care | Payer: MEDICARE | Source: Ambulatory Visit | Attending: Cardiovascular Disease | Admitting: Cardiovascular Disease

## 2016-11-22 DIAGNOSIS — E7849 Other hyperlipidemia: Secondary | ICD-10-CM

## 2016-11-22 DIAGNOSIS — E784 Other hyperlipidemia: Secondary | ICD-10-CM | POA: Diagnosis not present

## 2016-12-05 ENCOUNTER — Telehealth (HOSPITAL_COMMUNITY): Payer: Self-pay

## 2016-12-05 NOTE — Telephone Encounter (Signed)
VAS US CAROTID  Order: [id number]  Status:  Final result Visible to patient:  Yes (MyChart) Dx:  Other hyperlipidemia  Notes recorded by Effie Berkshire, RN on 12/05/2016 at 1:51 PM EDT Left detailed message on patient's private voicemail ------  Notes recorded by Shirley Friar, PA-C on 11/29/2016 at 7:34 AM EDT Reviewed.   Heterogeneous plaque, bilaterally. 1-39% RICA stenosis. Normal left carotid artery. Normal subclavian arteries, bilaterally. Patent vertebral arteries with antegrade flow.  Likely will need repeat 1 year.    Legrand Como [address]" [place], [place] 11/29/2016 7:33 AM

## 2017-01-22 DIAGNOSIS — Z23 Encounter for immunization: Secondary | ICD-10-CM | POA: Diagnosis not present

## 2017-02-14 ENCOUNTER — Encounter: Payer: Self-pay | Admitting: General Practice

## 2017-02-14 ENCOUNTER — Ambulatory Visit (INDEPENDENT_AMBULATORY_CARE_PROVIDER_SITE_OTHER): Payer: MEDICARE | Admitting: Family Medicine

## 2017-02-14 ENCOUNTER — Encounter: Payer: Self-pay | Admitting: Family Medicine

## 2017-02-14 VITALS — BP 124/78 | HR 70 | Temp 98.0°F | Resp 16 | Ht 63.0 in | Wt 163.5 lb

## 2017-02-14 DIAGNOSIS — I10 Essential (primary) hypertension: Secondary | ICD-10-CM | POA: Diagnosis not present

## 2017-02-14 DIAGNOSIS — E785 Hyperlipidemia, unspecified: Secondary | ICD-10-CM

## 2017-02-14 LAB — CBC WITH DIFFERENTIAL/PLATELET
BASOS ABS: 0.1 10*3/uL (ref 0.0–0.1)
Basophils Relative: 1.6 % (ref 0.0–3.0)
EOS ABS: 0.3 10*3/uL (ref 0.0–0.7)
Eosinophils Relative: 5.4 % — ABNORMAL HIGH (ref 0.0–5.0)
HEMATOCRIT: 39.7 % (ref 36.0–46.0)
Hemoglobin: 13.1 g/dL (ref 12.0–15.0)
LYMPHS ABS: 1.7 10*3/uL (ref 0.7–4.0)
LYMPHS PCT: 30 % (ref 12.0–46.0)
MCHC: 33.1 g/dL (ref 30.0–36.0)
MCV: 89.3 fl (ref 78.0–100.0)
MONOS PCT: 8.4 % (ref 3.0–12.0)
Monocytes Absolute: 0.5 10*3/uL (ref 0.1–1.0)
NEUTROS PCT: 54.6 % (ref 43.0–77.0)
Neutro Abs: 3 10*3/uL (ref 1.4–7.7)
PLATELETS: 278 10*3/uL (ref 150.0–400.0)
RBC: 4.45 Mil/uL (ref 3.87–5.11)
RDW: 13.5 % (ref 11.5–15.5)
WBC: 5.5 10*3/uL (ref 4.0–10.5)

## 2017-02-14 LAB — BASIC METABOLIC PANEL
BUN: 18 mg/dL (ref 6–23)
CALCIUM: 10 mg/dL (ref 8.4–10.5)
CHLORIDE: 105 meq/L (ref 96–112)
CO2: 28 meq/L (ref 19–32)
CREATININE: 0.93 mg/dL (ref 0.40–1.20)
GFR: 62.42 mL/min (ref >60.00)
GLUCOSE: 100 mg/dL — AB (ref 70–99)
Potassium: 4.8 mEq/L (ref 3.5–5.1)
Sodium: 139 mEq/L (ref 135–145)

## 2017-02-14 LAB — HEPATIC FUNCTION PANEL
ALBUMIN: 4.5 g/dL (ref 3.5–5.2)
ALT: 15 U/L (ref 0–35)
AST: 16 U/L (ref 0–37)
Alkaline Phosphatase: 64 U/L (ref 39–117)
Bilirubin, Direct: 0.1 mg/dL (ref 0.0–0.3)
TOTAL PROTEIN: 7.1 g/dL (ref 6.0–8.3)
Total Bilirubin: 0.5 mg/dL (ref 0.2–1.2)

## 2017-02-14 LAB — LIPID PANEL
CHOLESTEROL: 199 mg/dL (ref 0–200)
HDL: 59.7 mg/dL (ref >39.00)
LDL Cholesterol: 114 mg/dL — ABNORMAL HIGH (ref 0–99)
NonHDL: 139.52
TRIGLYCERIDES: 128 mg/dL (ref 0.0–149.0)
Total CHOL/HDL Ratio: 3
VLDL: 25.6 mg/dL (ref 0.0–40.0)

## 2017-02-14 MED ORDER — ZOSTER VAC RECOMB ADJUVANTED 50 MCG/0.5ML IM SUSR: 0.5000 mL | Freq: Once | INTRAMUSCULAR | 1 refills | Status: AC

## 2017-02-14 NOTE — Assessment & Plan Note (Signed)
Chronic problem.  Well controlled today.  Asymptomatic.  Check labs.  No anticipated med changes.  Will follow. 

## 2017-02-14 NOTE — Progress Notes (Signed)
   Subjective:    Patient ID: Caitlyn Dunn, female    DOB: 07/07/1940, 76 y.o.   MRN: [id number]  HPI HTN- chronic problem, on Coreg, Valsartan w/ good control.  Denies CP, SOB, HAs, visual changes, edema.  Hyperlipidemia- chronic problem, on Simvastatin.  No regular exercise.  No abd pain, N/V, myalgias.   Review of Systems For ROS see HPI     Objective:   Physical Exam  Constitutional: She is oriented to person, place, and time. She appears well-developed and well-nourished. No distress.  HENT:  Head: Normocephalic and atraumatic.  Eyes: Conjunctivae and EOM are normal. Pupils are equal, round, and reactive to light.  Neck: Normal range of motion. Neck supple. No thyromegaly present.  Cardiovascular: Normal rate, regular rhythm, normal heart sounds and intact distal pulses.  No murmur heard. Pulmonary/Chest: Effort normal and breath sounds normal. No respiratory distress.  Abdominal: Soft. She exhibits no distension. There is no tenderness.  Musculoskeletal: She exhibits no edema.  Lymphadenopathy:    She has no cervical adenopathy.  Neurological: She is alert and oriented to person, place, and time.  Skin: Skin is warm and dry.  Psychiatric: She has a normal mood and affect. Her behavior is normal.  Vitals reviewed.         Assessment & Plan:

## 2017-02-14 NOTE — Patient Instructions (Signed)
Schedule your complete physical and Medicare Wellness Visit in 6 months We'll notify you of your lab results and make any changes if needed Try and make healthy food choices and get regular exercise Call with any questions or concerns Happy Fall!!!

## 2017-02-14 NOTE — Addendum Note (Signed)
Addended by: Desmond Dike L on: 02/14/2017 11:12 AM   Modules accepted: Orders

## 2017-02-14 NOTE — Assessment & Plan Note (Signed)
Chronic problem.  Tolerating statin w/o difficulty.  Check labs.  Adjust meds prn  

## 2017-04-18 DIAGNOSIS — R635 Abnormal weight gain: Secondary | ICD-10-CM | POA: Diagnosis not present

## 2017-04-18 DIAGNOSIS — Z6829 Body mass index (BMI) 29.0-29.9, adult: Secondary | ICD-10-CM | POA: Diagnosis not present

## 2017-04-18 DIAGNOSIS — E663 Overweight: Secondary | ICD-10-CM | POA: Diagnosis not present

## 2017-04-26 DIAGNOSIS — Z90710 Acquired absence of both cervix and uterus: Secondary | ICD-10-CM | POA: Diagnosis not present

## 2017-04-26 DIAGNOSIS — Z9889 Other specified postprocedural states: Secondary | ICD-10-CM | POA: Diagnosis not present

## 2017-04-26 DIAGNOSIS — R7303 Prediabetes: Secondary | ICD-10-CM | POA: Diagnosis not present

## 2017-04-26 DIAGNOSIS — Z6829 Body mass index (BMI) 29.0-29.9, adult: Secondary | ICD-10-CM | POA: Diagnosis not present

## 2017-04-26 DIAGNOSIS — I10 Essential (primary) hypertension: Secondary | ICD-10-CM | POA: Diagnosis not present

## 2017-04-26 DIAGNOSIS — E663 Overweight: Secondary | ICD-10-CM | POA: Diagnosis not present

## 2017-04-26 DIAGNOSIS — K219 Gastro-esophageal reflux disease without esophagitis: Secondary | ICD-10-CM | POA: Diagnosis not present

## 2017-05-16 DIAGNOSIS — E663 Overweight: Secondary | ICD-10-CM | POA: Diagnosis not present

## 2017-05-16 DIAGNOSIS — Z713 Dietary counseling and surveillance: Secondary | ICD-10-CM | POA: Diagnosis not present

## 2017-05-16 DIAGNOSIS — Z6827 Body mass index (BMI) 27.0-27.9, adult: Secondary | ICD-10-CM | POA: Diagnosis not present

## 2017-05-22 DIAGNOSIS — R7303 Prediabetes: Secondary | ICD-10-CM | POA: Diagnosis not present

## 2017-05-22 DIAGNOSIS — Z6828 Body mass index (BMI) 28.0-28.9, adult: Secondary | ICD-10-CM | POA: Diagnosis not present

## 2017-05-22 DIAGNOSIS — E663 Overweight: Secondary | ICD-10-CM | POA: Diagnosis not present

## 2017-05-22 DIAGNOSIS — I10 Essential (primary) hypertension: Secondary | ICD-10-CM | POA: Diagnosis not present

## 2017-06-13 DIAGNOSIS — E663 Overweight: Secondary | ICD-10-CM | POA: Diagnosis not present

## 2017-06-13 DIAGNOSIS — Z713 Dietary counseling and surveillance: Secondary | ICD-10-CM | POA: Diagnosis not present

## 2017-06-13 DIAGNOSIS — Z6827 Body mass index (BMI) 27.0-27.9, adult: Secondary | ICD-10-CM | POA: Diagnosis not present

## 2017-07-05 ENCOUNTER — Other Ambulatory Visit: Payer: Self-pay | Admitting: Family Medicine

## 2017-07-05 DIAGNOSIS — Z1231 Encounter for screening mammogram for malignant neoplasm of breast: Secondary | ICD-10-CM

## 2017-07-09 DIAGNOSIS — Z6827 Body mass index (BMI) 27.0-27.9, adult: Secondary | ICD-10-CM | POA: Diagnosis not present

## 2017-07-09 DIAGNOSIS — I10 Essential (primary) hypertension: Secondary | ICD-10-CM | POA: Diagnosis not present

## 2017-07-09 DIAGNOSIS — E663 Overweight: Secondary | ICD-10-CM | POA: Diagnosis not present

## 2017-07-09 DIAGNOSIS — Z713 Dietary counseling and surveillance: Secondary | ICD-10-CM | POA: Diagnosis not present

## 2017-07-26 ENCOUNTER — Ambulatory Visit
Admission: RE | Admit: 2017-07-26 | Discharge: 2017-07-26 | Disposition: A | Discharge status: Home / Self Care | Payer: MEDICARE | Source: Ambulatory Visit | Attending: Family Medicine | Admitting: Family Medicine

## 2017-07-26 DIAGNOSIS — Z1231 Encounter for screening mammogram for malignant neoplasm of breast: Secondary | ICD-10-CM

## 2017-08-16 ENCOUNTER — Other Ambulatory Visit (HOSPITAL_COMMUNITY): Payer: Self-pay | Admitting: Internal Medicine

## 2017-08-22 NOTE — Progress Notes (Addendum)
Subjective:   Caitlyn Dunn is a 77 y.o. female who presents for Medicare Annual (Subsequent) preventive examination.  Review of Systems:  No ROS.  Medicare Wellness Visit. Additional risk factors are reflected in the social history.  Cardiac Risk Factors include: advanced age (>40men, >41 women);dyslipidemia;hypertension;family history of premature cardiovascular disease   Sleep patterns: Sleeps well.  Home Safety/Smoke Alarms: Feels safe in home. Smoke alarms in place.  Living environment; residence and Firearm Safety: Lives with husband in 61 story home. Seat Belt Safety/Bike Helmet: Wears seat belt.   Female:   Pap-N/A       Mammo-07/26/2017, BI-RADS CATEGORY  1: Negative       Dexa scan-10/20/2011, Osteopenia.  Declines further testing     CCS-UTD, due 2021.      Objective:     Vitals: BP 130/74 (BP Location: Left Arm, Patient Position: Sitting, Cuff Size: Normal)   Pulse 63   Ht 5\' 3"  (1.6 m)   Wt 146 lb 6 oz (66.4 kg)   SpO2 98%   BMI 25.93 kg/m   Body mass index is 25.93 kg/m.  Advanced Directives 08/23/2017 08/16/2016  Does Patient Have a Medical Advance Directive? No Yes  Type of Advance Directive - [place];Living will  Copy of [place] in Chart? - No - copy requested  Would patient like information on creating a medical advance directive? No - Patient declined -    Tobacco Social History   Tobacco Use  Smoking Status Never Smoker  Smokeless Tobacco Never Used     Counseling given: Not Answered    Past Medical History:  Diagnosis Date  . Allergy   . Anxiety   . CHF (congestive heart failure) ([place])   . Colon polyps   . Heart murmur    s/p MVR by Dr. Evelina Dun in 08/2005  . Hyperlipidemia   . Hypertension   . LV dysfunction    Echo 2010 EF 45-50%, Echo 2011 EF 55-60%   Past Surgical History:  Procedure Laterality Date  . ABDOMINAL HYSTERECTOMY    . BREAST EXCISIONAL BIOPSY Left    Benign   . BREAST  SURGERY    . MITRAL VALVE REPAIR  08/2005   by Dr. Evelina Dun   Family History  Problem Relation Age of Onset  . Stroke Mother   . Heart disease Mother   . Kidney disease Father   . Hypertension Father   . Heart disease Father   . COPD Maternal Grandmother   . Heart disease Maternal Grandfather   . Hypertension Paternal Grandmother   . Depression Sister   . Cancer Sister   . Cancer Maternal Aunt   . Cancer Maternal Uncle   . Stroke Sister    Social History   Socioeconomic History  . Marital status: Married    Spouse name: Not on file  . Number of children: Not on file  . Years of education: Not on file  . Highest education level: Not on file  Occupational History  . Not on file  Social Needs  . Financial resource strain: Not on file  . Food insecurity:    Worry: Not on file    Inability: Not on file  . Transportation needs:    Medical: Not on file    Non-medical: Not on file  Tobacco Use  . Smoking status: Never Smoker  . Smokeless tobacco: Never Used  Substance and Sexual Activity  . Alcohol use: Yes  Comment: 1-2 drinks wine daily with ice  . Drug use: No  . Sexual activity: Yes    Birth control/protection: None  Lifestyle  . Physical activity:    Days per week: Not on file    Minutes per session: Not on file  . Stress: Not on file  Relationships  . Social connections:    Talks on phone: Not on file    Gets together: Not on file    Attends religious service: Not on file    Active member of club or organization: Not on file    Attends meetings of clubs or organizations: Not on file    Relationship status: Not on file  Other Topics Concern  . Not on file  Social History Narrative  . Not on file    Outpatient Encounter Medications as of 08/23/2017  Medication Sig  . aspirin 81 MG tablet Take 81 mg by mouth daily.  Marland Kitchen BIOTIN PO Take by mouth.  . carvedilol (COREG) 12.5 MG tablet TAKE 1 TABLET (12.5 MG TOTAL) BY MOUTH 2 (TWO) TIMES DAILY WITH A MEAL.  Marland Kitchen  Cholecalciferol (VITAMIN D3) 2000 units TABS Take by mouth.  . clindamycin (CLEOCIN) 150 MG capsule Reported on 08/11/2015  . Multiple Vitamins-Minerals (MULTIVITAMIN GUMMIES WOMENS) CHEW Chew by mouth.  Marland Kitchen omeprazole (PRILOSEC) 20 MG capsule Take 20 mg by mouth daily.  . simvastatin (ZOCOR) 40 MG tablet TAKE 1 TABLET BY MOUTH EVERYDAY AT BEDTIME  . valsartan (DIOVAN) 40 MG tablet TAKE 1 TABLET (40 MG TOTAL) BY MOUTH 2 (TWO) TIMES DAILY.   No facility-administered encounter medications on file as of 08/23/2017.     Activities of Daily Living In your present state of health, do you have any difficulty performing the following activities: 08/23/2017 02/14/2017  Hearing? N N  Vision? N N  Difficulty concentrating or making decisions? N N  Walking or climbing stairs? N N  Dressing or bathing? N N  Doing errands, shopping? N N  Preparing Food and eating ? N -  Using the Toilet? N -  In the past six months, have you accidently leaked urine? N -  Do you have problems with loss of bowel control? N -  Managing your Medications? N -  Managing your Finances? N -  Housekeeping or managing your Housekeeping? N -  Some recent data might be hidden    Patient Care Team: Midge Minium, MD as PCP - General (Family Medicine) Bensimhon, Shaune Pascal, MD as Consulting Physician (Cardiology) Richmond Campbell, MD as Consulting Physician (Gastroenterology) Alden Hipp, MD as Consulting Physician (Obstetrics and Gynecology) Dermatology, Jaquelyn Bitter, Herbie Baltimore, MD as Consulting Physician (Orthopedic Surgery)    Assessment:   This is a routine wellness examination for Solei.  Exercise Activities and Dietary recommendations Current Exercise Habits: The patient does not participate in regular exercise at present(Maintains household), Exercise limited by: None identified   Diet (meal preparation, eat out, water intake, caffeinated beverages, dairy products, fruits and vegetables): Drinks diet soda and  water.   Breakfast: eggs, bacon, grapefruit; yogurt, granola; coffee (3 cups/morning) Lunch: sandwich; cracker and pb Dinner: eats out occasionally.      Goals    . Weight (lb) < 140 lb (63.5 kg)     Lose weight by increasing activity.     . Weight (lb) < 150 lb (68 kg)     Lose weight by increasing exercise and healthy food choices.        Fall Risk Fall Risk  08/23/2017  08/16/2016 08/11/2015  Falls in the past year? No Yes No    Depression Screen PHQ 2/9 Scores 08/23/2017 02/14/2017 08/16/2016 08/11/2015  PHQ - 2 Score 0 0 0 0  PHQ- 9 Score - 0 - -     Cognitive Function       Ad8 score reviewed for issues:  Issues making decisions: no  Less interest in hobbies / activities: no  Repeats questions, stories (family complaining): no  Trouble using ordinary gadgets (microwave, computer, phone): no  Forgets the month or year: no  Mismanaging finances: no  Remembering appts: no  Daily problems with thinking and/or memory: no Ad8 score is=0     Immunization History  Administered Date(s) Administered  . Influenza, High Dose Seasonal PF 02/03/2016  . Influenza,inj,Quad PF,6+ Mos 02/02/2014, 12/10/2014, 01/22/2017  . Pneumococcal Conjugate-13 08/11/2015  . Pneumococcal Polysaccharide-23 06/06/2011  . Tdap 02/20/2017  . Zoster 12/10/2014    Screening Tests Health Maintenance  Topic Date Due  . INFLUENZA VACCINE  11/08/2017  . COLONOSCOPY  04/11/2019  . TETANUS/TDAP  02/21/2027  . DEXA SCAN  Completed  . PNA vac Low Risk Adult  Completed        Plan:     Bring a copy of your living will and/or healthcare power of attorney to your next office visit.  Continue doing brain stimulating activities (puzzles, reading, adult coloring books, staying active) to keep memory sharp.    I have personally reviewed and noted the following in the patient's chart:   . Medical and social history . Use of alcohol, tobacco or illicit drugs  . Current medications and  supplements . Functional ability and status . Nutritional status . Physical activity . Advanced directives . List of other physicians . Hospitalizations, surgeries, and ER visits in previous 12 months . Vitals . Screenings to include cognitive, depression, and falls . Referrals and appointments  In addition, I have reviewed and discussed with patient certain preventive protocols, quality metrics, and best practice recommendations. A written personalized care plan for preventive services as well as general preventive health recommendations were provided to patient.     Gerilyn Nestle, RN  08/23/2017  PCP Notes: -F/U with PCP 08/30/17  Reviewed documentation provided by RN and agree w/ above.  Annye Asa, MD

## 2017-08-23 ENCOUNTER — Other Ambulatory Visit: Payer: Self-pay

## 2017-08-23 ENCOUNTER — Ambulatory Visit (INDEPENDENT_AMBULATORY_CARE_PROVIDER_SITE_OTHER): Payer: MEDICARE

## 2017-08-23 VITALS — BP 130/74 | HR 63 | Ht 63.0 in | Wt 146.4 lb

## 2017-08-23 DIAGNOSIS — Z0000 Encounter for general adult medical examination without abnormal findings: Secondary | ICD-10-CM | POA: Diagnosis not present

## 2017-08-23 NOTE — Patient Instructions (Addendum)

## 2017-08-30 ENCOUNTER — Ambulatory Visit (INDEPENDENT_AMBULATORY_CARE_PROVIDER_SITE_OTHER): Payer: MEDICARE | Admitting: Family Medicine

## 2017-08-30 ENCOUNTER — Ambulatory Visit: Payer: MEDICARE

## 2017-08-30 ENCOUNTER — Other Ambulatory Visit: Payer: Self-pay

## 2017-08-30 ENCOUNTER — Encounter: Payer: Self-pay | Admitting: Family Medicine

## 2017-08-30 VITALS — BP 98/68 | HR 69 | Temp 98.2°F | Resp 15 | Ht 62.0 in | Wt 145.0 lb

## 2017-08-30 DIAGNOSIS — E559 Vitamin D deficiency, unspecified: Secondary | ICD-10-CM

## 2017-08-30 DIAGNOSIS — Z0000 Encounter for general adult medical examination without abnormal findings: Secondary | ICD-10-CM

## 2017-08-30 DIAGNOSIS — E785 Hyperlipidemia, unspecified: Secondary | ICD-10-CM

## 2017-08-30 LAB — BASIC METABOLIC PANEL
BUN: 19 mg/dL (ref 6–23)
CO2: 24 meq/L (ref 19–32)
Calcium: 9.8 mg/dL (ref 8.4–10.5)
Chloride: 107 mEq/L (ref 96–112)
Creatinine, Ser: 0.82 mg/dL (ref 0.40–1.20)
GFR: 72.08 mL/min (ref >60.00)
GLUCOSE: 105 mg/dL — AB (ref 70–99)
Potassium: 4.3 mEq/L (ref 3.5–5.1)
Sodium: 140 mEq/L (ref 135–145)

## 2017-08-30 LAB — LIPID PANEL
CHOLESTEROL: 172 mg/dL (ref 0–200)
HDL: 55.7 mg/dL (ref >39.00)
LDL CALC: 93 mg/dL (ref 0–99)
NonHDL: 115.91
TRIGLYCERIDES: 115 mg/dL (ref 0.0–149.0)
Total CHOL/HDL Ratio: 3
VLDL: 23 mg/dL (ref 0.0–40.0)

## 2017-08-30 LAB — CBC WITH DIFFERENTIAL/PLATELET
BASOS ABS: 0.1 10*3/uL (ref 0.0–0.1)
Basophils Relative: 1 % (ref 0.0–3.0)
EOS ABS: 0.4 10*3/uL (ref 0.0–0.7)
Eosinophils Relative: 7 % — ABNORMAL HIGH (ref 0.0–5.0)
HEMATOCRIT: 39 % (ref 36.0–46.0)
Hemoglobin: 13 g/dL (ref 12.0–15.0)
LYMPHS ABS: 1.8 10*3/uL (ref 0.7–4.0)
Lymphocytes Relative: 32.8 % (ref 12.0–46.0)
MCHC: 33.3 g/dL (ref 30.0–36.0)
MCV: 88.7 fl (ref 78.0–100.0)
MONO ABS: 0.4 10*3/uL (ref 0.1–1.0)
MONOS PCT: 8 % (ref 3.0–12.0)
NEUTROS PCT: 51.2 % (ref 43.0–77.0)
Neutro Abs: 2.8 10*3/uL (ref 1.4–7.7)
Platelets: 277 10*3/uL (ref 150.0–400.0)
RBC: 4.4 Mil/uL (ref 3.87–5.11)
RDW: 13.9 % (ref 11.5–15.5)
WBC: 5.5 10*3/uL (ref 4.0–10.5)

## 2017-08-30 LAB — HEPATIC FUNCTION PANEL
ALBUMIN: 4.3 g/dL (ref 3.5–5.2)
ALT: 12 U/L (ref 0–35)
AST: 14 U/L (ref 0–37)
Alkaline Phosphatase: 61 U/L (ref 39–117)
Bilirubin, Direct: 0.1 mg/dL (ref 0.0–0.3)
TOTAL PROTEIN: 6.8 g/dL (ref 6.0–8.3)
Total Bilirubin: 0.4 mg/dL (ref 0.2–1.2)

## 2017-08-30 LAB — VITAMIN D 25 HYDROXY (VIT D DEFICIENCY, FRACTURES): VITD: 31.82 ng/mL (ref 30.00–100.00)

## 2017-08-30 LAB — TSH: TSH: 0.94 u[IU]/mL (ref 0.35–4.50)

## 2017-08-30 NOTE — Patient Instructions (Addendum)
Follow up in 6 months to recheck BP and cholesterol We'll notify you of your lab results and make any changes if needed Continue to work on healthy diet and regular exercise- you look great! Call with any questions or concerns Have a great summer!!! 

## 2017-08-30 NOTE — Assessment & Plan Note (Signed)
Pt's PE WNL.  UTD on colonoscopy, mammo, immunizations.  Applauded her 18 lb weight loss.  Check labs.  Anticipatory guidance provided.

## 2017-08-30 NOTE — Assessment & Plan Note (Signed)
Pt w/ hx of this.  Check labs and replete prn.

## 2017-08-30 NOTE — Assessment & Plan Note (Signed)
Chronic problem.  Tolerating statin w/o difficulty.  Check labs.  Adjust meds prn  

## 2017-08-30 NOTE — Progress Notes (Signed)
   Subjective:    Patient ID: Caitlyn Dunn, female    DOB: [birth year], 77 y.o.   MRN: [id number]  HPI CPE- UTD on colonoscopy, mammo, immunizations.  I down 18 lbs from November.  Went to [facility] for Medically supervised Weight Loss.  Pt has had episodes of hypotension w/ her recent weight loss.  Due for f/u w/ Cardiology in July.   Review of Systems Patient reports no vision/ hearing changes, adenopathy,fever, weight change,  persistant/recurrent hoarseness , swallowing issues, chest pain, palpitations, edema, persistant/recurrent cough, hemoptysis, dyspnea (rest/exertional/paroxysmal nocturnal), gastrointestinal bleeding (melena, rectal bleeding), abdominal pain, significant heartburn, bowel changes, GU symptoms (dysuria, hematuria, incontinence), Gyn symptoms (abnormal  bleeding, pain),  syncope, focal weakness, memory loss, numbness & tingling, skin/hair/nail changes, abnormal bruising or bleeding, anxiety, or depression.     Objective:   Physical Exam General Appearance:    Alert, cooperative, no distress, appears stated age  Head:    Normocephalic, without obvious abnormality, atraumatic  Eyes:    PERRL, conjunctiva/corneas clear, EOM's intact, fundi    benign, both eyes  Ears:    Normal TM's and external ear canals, both ears  Nose:   Nares normal, septum midline, mucosa normal, no drainage    or sinus tenderness  Throat:   Lips, mucosa, and tongue normal; teeth and gums normal  Neck:   Supple, symmetrical, trachea midline, no adenopathy;    Thyroid: no enlargement/tenderness/nodules  Back:     Symmetric, no curvature, ROM normal, no CVA tenderness  Lungs:     Clear to auscultation bilaterally, respirations unlabored  Chest Wall:    No tenderness or deformity   Heart:    Regular rate and rhythm, S1 and S2 normal, no murmur, rub   or gallop  Breast Exam:    Deferred to mammo  Abdomen:     Soft, non-tender, bowel sounds active all four quadrants,    no masses, no organomegaly    Genitalia:    Deferred  Rectal:    Extremities:   Extremities normal, atraumatic, no cyanosis or edema  Pulses:   2+ and symmetric all extremities  Skin:   Skin color, texture, turgor normal, no rashes or lesions  Lymph nodes:   Cervical, supraclavicular, and axillary nodes normal  Neurologic:   CNII-XII intact, normal strength, sensation and reflexes    throughout          Assessment & Plan:

## 2017-08-31 DIAGNOSIS — Z713 Dietary counseling and surveillance: Secondary | ICD-10-CM | POA: Diagnosis not present

## 2017-08-31 DIAGNOSIS — Z6826 Body mass index (BMI) 26.0-26.9, adult: Secondary | ICD-10-CM | POA: Diagnosis not present

## 2017-08-31 DIAGNOSIS — E663 Overweight: Secondary | ICD-10-CM | POA: Diagnosis not present

## 2017-09-13 DIAGNOSIS — I10 Essential (primary) hypertension: Secondary | ICD-10-CM | POA: Diagnosis not present

## 2017-09-13 DIAGNOSIS — E663 Overweight: Secondary | ICD-10-CM | POA: Diagnosis not present

## 2017-09-13 DIAGNOSIS — Z6826 Body mass index (BMI) 26.0-26.9, adult: Secondary | ICD-10-CM | POA: Diagnosis not present

## 2017-09-13 DIAGNOSIS — D225 Melanocytic nevi of trunk: Secondary | ICD-10-CM | POA: Diagnosis not present

## 2017-09-13 DIAGNOSIS — L814 Other melanin hyperpigmentation: Secondary | ICD-10-CM | POA: Diagnosis not present

## 2017-09-13 DIAGNOSIS — L821 Other seborrheic keratosis: Secondary | ICD-10-CM | POA: Diagnosis not present

## 2017-09-13 DIAGNOSIS — D1801 Hemangioma of skin and subcutaneous tissue: Secondary | ICD-10-CM | POA: Diagnosis not present

## 2017-09-13 DIAGNOSIS — L7211 Pilar cyst: Secondary | ICD-10-CM | POA: Diagnosis not present

## 2017-09-13 DIAGNOSIS — R7303 Prediabetes: Secondary | ICD-10-CM | POA: Diagnosis not present

## 2017-09-17 DIAGNOSIS — Z713 Dietary counseling and surveillance: Secondary | ICD-10-CM | POA: Diagnosis not present

## 2017-09-17 DIAGNOSIS — E663 Overweight: Secondary | ICD-10-CM | POA: Diagnosis not present

## 2017-09-17 DIAGNOSIS — Z6826 Body mass index (BMI) 26.0-26.9, adult: Secondary | ICD-10-CM | POA: Diagnosis not present

## 2017-11-16 ENCOUNTER — Other Ambulatory Visit (HOSPITAL_COMMUNITY): Payer: Self-pay | Admitting: Internal Medicine

## 2017-11-16 DIAGNOSIS — I10 Essential (primary) hypertension: Secondary | ICD-10-CM

## 2017-12-08 ENCOUNTER — Other Ambulatory Visit (HOSPITAL_COMMUNITY): Payer: Self-pay | Admitting: Internal Medicine

## 2017-12-08 DIAGNOSIS — I10 Essential (primary) hypertension: Secondary | ICD-10-CM

## 2018-01-03 ENCOUNTER — Other Ambulatory Visit (HOSPITAL_COMMUNITY): Payer: Self-pay | Admitting: Internal Medicine

## 2018-01-03 DIAGNOSIS — I10 Essential (primary) hypertension: Secondary | ICD-10-CM

## 2018-01-18 ENCOUNTER — Other Ambulatory Visit (HOSPITAL_COMMUNITY): Payer: Self-pay | Admitting: *Deleted

## 2018-01-18 DIAGNOSIS — Z9889 Other specified postprocedural states: Secondary | ICD-10-CM

## 2018-01-18 NOTE — Progress Notes (Signed)
Pt sch for yearly f/u and echo with Dr Haroldine Laws, order placed

## 2018-01-25 DIAGNOSIS — Z23 Encounter for immunization: Secondary | ICD-10-CM | POA: Diagnosis not present

## 2018-02-26 ENCOUNTER — Encounter: Payer: Self-pay | Admitting: Family Medicine

## 2018-02-26 ENCOUNTER — Other Ambulatory Visit: Payer: Self-pay

## 2018-02-26 ENCOUNTER — Ambulatory Visit (INDEPENDENT_AMBULATORY_CARE_PROVIDER_SITE_OTHER): Payer: MEDICARE | Admitting: Family Medicine

## 2018-02-26 VITALS — BP 128/82 | HR 77 | Temp 98.2°F | Resp 17 | Ht 62.0 in | Wt 143.4 lb

## 2018-02-26 DIAGNOSIS — I10 Essential (primary) hypertension: Secondary | ICD-10-CM

## 2018-02-26 DIAGNOSIS — I5022 Chronic systolic (congestive) heart failure: Secondary | ICD-10-CM

## 2018-02-26 DIAGNOSIS — E785 Hyperlipidemia, unspecified: Secondary | ICD-10-CM | POA: Diagnosis not present

## 2018-02-26 LAB — CBC WITH DIFFERENTIAL/PLATELET
BASOS ABS: 0 10*3/uL (ref 0.0–0.1)
Basophils Relative: 0.7 % (ref 0.0–3.0)
Eosinophils Absolute: 0.3 10*3/uL (ref 0.0–0.7)
Eosinophils Relative: 5.1 % — ABNORMAL HIGH (ref 0.0–5.0)
HCT: 39.8 % (ref 36.0–46.0)
Hemoglobin: 13.3 g/dL (ref 12.0–15.0)
Lymphocytes Relative: 30.8 % (ref 12.0–46.0)
Lymphs Abs: 1.9 10*3/uL (ref 0.7–4.0)
MCHC: 33.4 g/dL (ref 30.0–36.0)
MCV: 89.4 fl (ref 78.0–100.0)
Monocytes Absolute: 0.5 10*3/uL (ref 0.1–1.0)
Monocytes Relative: 7.5 % (ref 3.0–12.0)
NEUTROS ABS: 3.5 10*3/uL (ref 1.4–7.7)
Neutrophils Relative %: 55.9 % (ref 43.0–77.0)
PLATELETS: 273 10*3/uL (ref 150.0–400.0)
RBC: 4.46 Mil/uL (ref 3.87–5.11)
RDW: 13.5 % (ref 11.5–15.5)
WBC: 6.3 10*3/uL (ref 4.0–10.5)

## 2018-02-26 LAB — HEPATIC FUNCTION PANEL
ALBUMIN: 4.6 g/dL (ref 3.5–5.2)
ALK PHOS: 62 U/L (ref 39–117)
ALT: 16 U/L (ref 0–35)
AST: 18 U/L (ref 0–37)
Bilirubin, Direct: 0.1 mg/dL (ref 0.0–0.3)
TOTAL PROTEIN: 7.3 g/dL (ref 6.0–8.3)
Total Bilirubin: 0.5 mg/dL (ref 0.2–1.2)

## 2018-02-26 LAB — BASIC METABOLIC PANEL
BUN: 19 mg/dL (ref 6–23)
CHLORIDE: 105 meq/L (ref 96–112)
CO2: 27 meq/L (ref 19–32)
Calcium: 10.1 mg/dL (ref 8.4–10.5)
Creatinine, Ser: 0.9 mg/dL (ref 0.40–1.20)
GFR: 64.65 mL/min (ref >60.00)
GLUCOSE: 103 mg/dL — AB (ref 70–99)
POTASSIUM: 4.2 meq/L (ref 3.5–5.1)
SODIUM: 140 meq/L (ref 135–145)

## 2018-02-26 LAB — LIPID PANEL
CHOLESTEROL: 170 mg/dL (ref 0–200)
HDL: 66.6 mg/dL (ref >39.00)
LDL Cholesterol: 86 mg/dL (ref 0–99)
NonHDL: 103.2
Total CHOL/HDL Ratio: 3
Triglycerides: 87 mg/dL (ref 0.0–149.0)
VLDL: 17.4 mg/dL (ref 0.0–40.0)

## 2018-02-26 LAB — TSH: TSH: 1.49 u[IU]/mL (ref 0.35–4.50)

## 2018-02-26 NOTE — Progress Notes (Signed)
   Subjective:    Patient ID: Caitlyn Dunn, female    DOB: February 11, 1941, 77 y.o.   MRN: [id number]  HPI HTN- chronic problem, on Coreg 12.5mg  BID, Diovan 40mg  daily.  No CP, SOB, HAs, visual changes, edema.  Hyperlipidemia- chronic problem, on Simvastatin 40mg  daily.  No abd pain, N/V.  CHF- has upcoming appt w/ Dr Haroldine Laws.  On ARB, beta blocker, ASA.  No swelling, cough/wheezing.   Review of Systems For ROS see HPI     Objective:   Physical Exam  Constitutional: She is oriented to person, place, and time. She appears well-developed and well-nourished. No distress.  HENT:  Head: Normocephalic and atraumatic.  Eyes: Pupils are equal, round, and reactive to light. Conjunctivae and EOM are normal.  Neck: Normal range of motion. Neck supple. No thyromegaly present.  Cardiovascular: Normal rate, regular rhythm, normal heart sounds and intact distal pulses.  No murmur heard. Pulmonary/Chest: Effort normal and breath sounds normal. No respiratory distress.  Abdominal: Soft. She exhibits no distension. There is no tenderness.  Musculoskeletal: She exhibits no edema.  Lymphadenopathy:    She has no cervical adenopathy.  Neurological: She is alert and oriented to person, place, and time.  Skin: Skin is warm and dry.  Psychiatric: She has a normal mood and affect. Her behavior is normal.  Vitals reviewed.         Assessment & Plan:

## 2018-02-26 NOTE — Assessment & Plan Note (Signed)
Chronic problem.  Tolerating statin w/o difficulty.  Check labs.  Adjust meds prn  

## 2018-02-26 NOTE — Assessment & Plan Note (Signed)
Chronic problem, well controlled today.  Asymptomatic.  Check labs.  No anticipated med changes.  Will follow. 

## 2018-02-26 NOTE — Patient Instructions (Signed)
Follow up in 6 months to recheck BP and cholesterol We'll notify you of your lab results and make any changes if needed Continue to work on healthy diet and regular exercise Call with any questions or concerns Happy Holidays!!

## 2018-02-26 NOTE — Assessment & Plan Note (Signed)
Following w/ Cardiology.  On ARB, beta blocker, ASA

## 2018-03-14 ENCOUNTER — Ambulatory Visit (HOSPITAL_COMMUNITY)
Admission: RE | Admit: 2018-03-14 | Discharge: 2018-03-14 | Disposition: A | Discharge status: Home / Self Care | Payer: MEDICARE | Source: Ambulatory Visit | Attending: Family Medicine | Admitting: Family Medicine

## 2018-03-14 ENCOUNTER — Encounter (HOSPITAL_COMMUNITY): Payer: MEDICARE | Admitting: Internal Medicine

## 2018-03-14 DIAGNOSIS — I110 Hypertensive heart disease with heart failure: Secondary | ICD-10-CM | POA: Insufficient documentation

## 2018-03-14 DIAGNOSIS — I509 Heart failure, unspecified: Secondary | ICD-10-CM | POA: Diagnosis not present

## 2018-03-14 DIAGNOSIS — E785 Hyperlipidemia, unspecified: Secondary | ICD-10-CM | POA: Insufficient documentation

## 2018-03-14 DIAGNOSIS — Z9889 Other specified postprocedural states: Secondary | ICD-10-CM

## 2018-03-14 DIAGNOSIS — I361 Nonrheumatic tricuspid (valve) insufficiency: Secondary | ICD-10-CM | POA: Insufficient documentation

## 2018-03-14 NOTE — Progress Notes (Signed)
  Echocardiogram 2D Echocardiogram has been performed.  Caitlyn Dunn [name] 03/14/2018, 9:57 AM

## 2018-05-07 ENCOUNTER — Ambulatory Visit (HOSPITAL_COMMUNITY)
Admission: RE | Admit: 2018-05-07 | Discharge: 2018-05-07 | Disposition: A | Discharge status: Home / Self Care | Payer: MEDICARE | Source: Ambulatory Visit | Attending: Internal Medicine | Admitting: Internal Medicine

## 2018-05-07 ENCOUNTER — Other Ambulatory Visit (HOSPITAL_COMMUNITY): Payer: Self-pay

## 2018-05-07 VITALS — BP 160/94 | HR 65 | Wt 145.8 lb

## 2018-05-07 DIAGNOSIS — Z952 Presence of prosthetic heart valve: Secondary | ICD-10-CM | POA: Diagnosis not present

## 2018-05-07 DIAGNOSIS — I6523 Occlusion and stenosis of bilateral carotid arteries: Secondary | ICD-10-CM | POA: Diagnosis not present

## 2018-05-07 DIAGNOSIS — Z79899 Other long term (current) drug therapy: Secondary | ICD-10-CM | POA: Diagnosis not present

## 2018-05-07 DIAGNOSIS — Z8249 Family history of ischemic heart disease and other diseases of the circulatory system: Secondary | ICD-10-CM | POA: Diagnosis not present

## 2018-05-07 DIAGNOSIS — E785 Hyperlipidemia, unspecified: Secondary | ICD-10-CM | POA: Insufficient documentation

## 2018-05-07 DIAGNOSIS — I428 Other cardiomyopathies: Secondary | ICD-10-CM | POA: Diagnosis not present

## 2018-05-07 DIAGNOSIS — I10 Essential (primary) hypertension: Secondary | ICD-10-CM

## 2018-05-07 DIAGNOSIS — Z9889 Other specified postprocedural states: Secondary | ICD-10-CM | POA: Diagnosis not present

## 2018-05-07 DIAGNOSIS — I509 Heart failure, unspecified: Secondary | ICD-10-CM | POA: Insufficient documentation

## 2018-05-07 DIAGNOSIS — I110 Hypertensive heart disease with heart failure: Secondary | ICD-10-CM | POA: Diagnosis not present

## 2018-05-07 DIAGNOSIS — I340 Nonrheumatic mitral (valve) insufficiency: Secondary | ICD-10-CM | POA: Diagnosis not present

## 2018-05-07 DIAGNOSIS — Z7982 Long term (current) use of aspirin: Secondary | ICD-10-CM | POA: Diagnosis not present

## 2018-05-07 MED ORDER — CARVEDILOL 12.5 MG PO TABS: 12.5000 mg | ORAL_TABLET | Freq: Two times a day (BID) | ORAL | 3 refills | Status: DC

## 2018-05-07 MED ORDER — SIMVASTATIN 40 MG PO TABS: ORAL_TABLET | ORAL | 3 refills | Status: DC

## 2018-05-07 MED ORDER — VALSARTAN 40 MG PO TABS: 40.0000 mg | ORAL_TABLET | Freq: Two times a day (BID) | ORAL | 3 refills | Status: DC

## 2018-05-07 NOTE — Addendum Note (Signed)
Encounter addended by: Valeda Malm, RN on: 05/07/2018 12:41 PM  Actions taken: Clinical Note Signed

## 2018-05-07 NOTE — Patient Instructions (Signed)
Please keep a log of your blood pressure for two weeks. Please call [phone] option 2 to discuss.   Your physician recommends that you schedule a follow-up appointment in: 1 year, please call in October/November to schedule your 1 year f/u.

## 2018-05-07 NOTE — Progress Notes (Signed)
ADVANCED HF CLINIC NOTE  Patient ID: Caitlyn Dunn, female   DOB: 03-01-1941, 78 y.o.   MRN: [id number] HPI:  Caitlyn Dunn is a very pleasant 78 year old woman with a history of mitral regurgitation status post mitral valve repair with a ring by Dr. Evelina Dunn in May 2007. Preoperatively, her EF was 55%-60% with normal coronaries. However, postoperatively it dropped down to 25% for unclear reasons.   Echocardiogram 12/10 showed an ejection fraction 45-50% with LVIDed 4mm with mild MR.  Echo 11/11 showed EF 55-60% with no MR. Echo 04/2011 with EF 45-50%, diffuse hypokinesis, trivial MR with mean gradient 3 mm Hg and valve area 1.36 cm2 Echo (5/15) with EF 50-55%, s/p mitral valve repair with trivial MR, normal RV, and basal inferior hypokinesis. Echo 10/17/16 EF 50-55% s/p MV repair  She returns for f/u to review her echo. She went to [facility] and lost 20 pounds. Very active with housework. Able to go up and down stairs without problems. Taking abx prior to any dental procedures. Says SBP has typically been in 120s. Has cut back valsartan to 40 daily (from 40 bid). No edema, orthopnea or PND.   Echo 03/14/18; EF 45-50% MV ring stable mild MR. Personally reviewed   ROS: All systems negative except as listed in HPI, PMH and Problem List.  Past Medical History:  Diagnosis Date  . Allergy   . Anxiety   . CHF (congestive heart failure) ([place])   . Colon polyps   . Heart murmur    s/p MVR by Dr. Evelina Dunn in 08/2005  . Hyperlipidemia   . Hypertension   . LV dysfunction    Echo 2010 EF 45-50%, Echo 2011 EF 55-60%  Family history of CAD  Current Outpatient Medications  Medication Sig Dispense Refill  . aspirin 81 MG tablet Take 81 mg by mouth daily.    Marland Kitchen BIOTIN PO Take by mouth.    . carvedilol (COREG) 12.5 MG tablet TAKE 1 TABLET (12.5 MG TOTAL) BY MOUTH 2 (TWO) TIMES DAILY WITH A MEAL. 180 tablet 3  . Cholecalciferol (VITAMIN D3) 2000 units TABS Take by mouth.    . clindamycin  (CLEOCIN) 150 MG capsule Reported on 08/11/2015  2  . Multiple Vitamins-Minerals (MULTIVITAMIN GUMMIES WOMENS) CHEW Chew by mouth.    Marland Kitchen omeprazole (PRILOSEC) 20 MG capsule Take 20 mg by mouth daily.    . simvastatin (ZOCOR) 40 MG tablet TAKE 1 TABLET BY MOUTH EVERYDAY AT BEDTIME 90 tablet 3  . valsartan (DIOVAN) 40 MG tablet TAKE 1 TABLET (40 MG TOTAL) BY MOUTH 2 (TWO) TIMES DAILY. 60 tablet 0   No current facility-administered medications for this encounter.      PHYSICAL EXAM: Vitals:   05/07/18 1149  BP: (!) 160/94  Pulse: 65  SpO2: 96%  Weight: 66.1 kg (145 lb 12.8 oz)   General:  Well appearing. No resp difficulty HEENT: normal Neck: supple. no JVD. Carotids 2+ bilat; no bruits. No lymphadenopathy or thryomegaly appreciated. Cor: PMI nondisplaced. Regular rate & rhythm. No rubs, gallops or murmurs. Lungs: clear Abdomen: soft, nontender, nondistended. No hepatosplenomegaly. No bruits or masses. Good bowel sounds. Extremities: no cyanosis, clubbing, rash, edema Neuro: alert & orientedx3, cranial nerves grossly intact. moves all 4 extremities w/o difficulty. Affect pleasant   ASSESSMENT & PLAN: 1. S/p mitral valve repair:     -- Echo reviewed personally. MVR stable Continue SBE prophylaxis with procedures.      -- Reinforced need for SBP prophylaxis  2.  Nonischemic cardiomyopathy: Decreased EF immediately after MV repair likely related to effect of long-standing MR.     -- Echo 12/19 EF 45-50% NYHA I. Volume status ok. Continue carvedilol. Increase valsartan back to 40 bid.     -- Encouraged there to be more active    -- BP today remains elevated. As above, will increase valsartan back to 40 bid. I have asked her to keep a daily BP log for 2 weeks and send it to me. If SBP consistently > 130 can consider increase valsartan or switch to Entresto (or add spiro).   3. HTN   -- BP elevated. Plan as above.  4. Carotid plaque    -- u/s 8/18 with 1-39% bllaterally. Continue statin.    Glori Bickers MD 05/07/2018

## 2018-05-22 ENCOUNTER — Telehealth (HOSPITAL_COMMUNITY): Payer: Self-pay

## 2018-05-22 NOTE — Telephone Encounter (Signed)
01/28    131/82             59 01/29    120/77             61 01/30    122/78             61 01/31    121/79             63 02/01    107/66             70 02/02    109/71             62 02/03    96/58               71 02/04    126/84             63 02/05    123/73             65 02/06    121/77             61 02/07    125/81             63 02/09    136/84 am       63 02/09    124/84 pm       63 02/10    106/65             67 02/11    119/71             61 02/12    120/79             66  Pt's 2 week bp check at home.

## 2018-05-23 NOTE — Telephone Encounter (Signed)
BPs well controlled. Please let her know that I am happy with results and will continue current plan unless she is having any symptoms of low BP.

## 2018-05-23 NOTE — Telephone Encounter (Signed)
Pt.notified

## 2018-05-31 ENCOUNTER — Telehealth (HOSPITAL_COMMUNITY): Payer: Self-pay

## 2018-05-31 NOTE — Telephone Encounter (Signed)
error 

## 2018-05-31 NOTE — Telephone Encounter (Signed)
Pt called to report that she is concerned about taking the simvastatin. Pt states that she is having very bad muscle pain and it is worst at night for the last 2 weeks. Pt states that she has not taken it in the last week and she feels better. She was currently taking simvastatin 40 mg daily at bedtime. Please advise.

## 2018-06-07 NOTE — Telephone Encounter (Signed)
Pt notified. Verbalizes understanding and she will contact her PCP. Pt states that she started back on simvastatin on Wed 02/26 and is taking it every other night.

## 2018-06-07 NOTE — Telephone Encounter (Signed)
That's fine. She can talk to her PCP about trying another statin with lower risk of myalgia side effect.

## 2018-06-12 ENCOUNTER — Encounter: Payer: Self-pay | Admitting: Family Medicine

## 2018-06-12 ENCOUNTER — Ambulatory Visit (INDEPENDENT_AMBULATORY_CARE_PROVIDER_SITE_OTHER): Payer: MEDICARE | Admitting: Family Medicine

## 2018-06-12 ENCOUNTER — Other Ambulatory Visit: Payer: Self-pay

## 2018-06-12 VITALS — BP 130/84 | HR 62 | Temp 98.0°F | Resp 14 | Ht 62.0 in | Wt 144.0 lb

## 2018-06-12 DIAGNOSIS — E785 Hyperlipidemia, unspecified: Secondary | ICD-10-CM

## 2018-06-12 DIAGNOSIS — I6523 Occlusion and stenosis of bilateral carotid arteries: Secondary | ICD-10-CM

## 2018-06-12 MED ORDER — ROSUVASTATIN CALCIUM 10 MG PO TABS: 10.0000 mg | ORAL_TABLET | Freq: Every day | ORAL | 3 refills | Status: DC

## 2018-06-12 NOTE — Patient Instructions (Addendum)
Follow up as needed or as scheduled STOP the Simvastatin START the Rosuvastatin (Crestor) night ADD OTC CoQ10 supplement to improve muscle aches Call with any questions or concerns Hang in there!!!

## 2018-06-12 NOTE — Progress Notes (Signed)
   Subjective:    Patient ID: Caitlyn Dunn, female    DOB: 12-05-1940, 78 y.o.   MRN: [id number]  HPI Hyperlipidemia- ongoing issue for pt.  Stopped her Simvastatin on Sunday due to muscle aches.  Cardiology is currently prescribing statin but pt was advised to call PCP to discuss.  Pt was having severe muscle aches, particularly at night.  Myalgias improved after stopping medication.   Review of Systems For ROS see HPI     Objective:   Physical Exam Vitals signs reviewed.  Constitutional:      General: She is not in acute distress.    Appearance: Normal appearance. She is normal weight. She is not ill-appearing.  Neurological:     General: No focal deficit present.     Mental Status: She is alert and oriented to person, place, and time.  Psychiatric:        Mood and Affect: Mood normal.        Behavior: Behavior normal.        Thought Content: Thought content normal.           Assessment & Plan:

## 2018-06-12 NOTE — Assessment & Plan Note (Signed)
Pt reports severe myalgias w/ Simvastatin use.  Sxs resolved when she stopped medication.  Will start Crestor which has lower risk of myalgias and is more potent so we can use a lower dose.  Will also add CoQ10.  Pt expressed understanding and is in agreement w/ plan.

## 2018-08-19 ENCOUNTER — Other Ambulatory Visit: Payer: Self-pay | Admitting: Family Medicine

## 2018-08-27 ENCOUNTER — Ambulatory Visit: Payer: MEDICARE | Admitting: Family Medicine

## 2018-09-11 ENCOUNTER — Ambulatory Visit (INDEPENDENT_AMBULATORY_CARE_PROVIDER_SITE_OTHER): Payer: MEDICARE | Admitting: Family Medicine

## 2018-09-11 ENCOUNTER — Other Ambulatory Visit: Payer: Self-pay

## 2018-09-11 ENCOUNTER — Encounter: Payer: Self-pay | Admitting: Family Medicine

## 2018-09-11 VITALS — BP 121/81 | HR 68 | Temp 98.3°F | Resp 16 | Ht 63.0 in | Wt 144.0 lb

## 2018-09-11 DIAGNOSIS — I6523 Occlusion and stenosis of bilateral carotid arteries: Secondary | ICD-10-CM

## 2018-09-11 DIAGNOSIS — E785 Hyperlipidemia, unspecified: Secondary | ICD-10-CM | POA: Diagnosis not present

## 2018-09-11 DIAGNOSIS — E559 Vitamin D deficiency, unspecified: Secondary | ICD-10-CM | POA: Diagnosis not present

## 2018-09-11 DIAGNOSIS — I10 Essential (primary) hypertension: Secondary | ICD-10-CM

## 2018-09-11 LAB — CBC WITH DIFFERENTIAL/PLATELET
Basophils Absolute: 0.1 10*3/uL (ref 0.0–0.1)
Basophils Relative: 1.2 % (ref 0.0–3.0)
Eosinophils Absolute: 0.5 10*3/uL (ref 0.0–0.7)
Eosinophils Relative: 9.4 % — ABNORMAL HIGH (ref 0.0–5.0)
HCT: 39.9 % (ref 36.0–46.0)
Hemoglobin: 13.3 g/dL (ref 12.0–15.0)
Lymphocytes Relative: 33.5 % (ref 12.0–46.0)
Lymphs Abs: 1.9 10*3/uL (ref 0.7–4.0)
MCHC: 33.2 g/dL (ref 30.0–36.0)
MCV: 89.6 fl (ref 78.0–100.0)
Monocytes Absolute: 0.5 10*3/uL (ref 0.1–1.0)
Monocytes Relative: 9 % (ref 3.0–12.0)
Neutro Abs: 2.6 10*3/uL (ref 1.4–7.7)
Neutrophils Relative %: 46.9 % (ref 43.0–77.0)
Platelets: 269 10*3/uL (ref 150.0–400.0)
RBC: 4.46 Mil/uL (ref 3.87–5.11)
RDW: 13.5 % (ref 11.5–15.5)
WBC: 5.6 10*3/uL (ref 4.0–10.5)

## 2018-09-11 LAB — BASIC METABOLIC PANEL
BUN: 17 mg/dL (ref 6–23)
CO2: 25 mEq/L (ref 19–32)
Calcium: 10 mg/dL (ref 8.4–10.5)
Chloride: 105 mEq/L (ref 96–112)
Creatinine, Ser: 0.9 mg/dL (ref 0.40–1.20)
GFR: 60.74 mL/min (ref >60.00)
Glucose, Bld: 98 mg/dL (ref 70–99)
Potassium: 4.4 mEq/L (ref 3.5–5.1)
Sodium: 139 mEq/L (ref 135–145)

## 2018-09-11 LAB — HEPATIC FUNCTION PANEL
ALT: 15 U/L (ref 0–35)
AST: 18 U/L (ref 0–37)
Albumin: 4.4 g/dL (ref 3.5–5.2)
Alkaline Phosphatase: 57 U/L (ref 39–117)
Bilirubin, Direct: 0.1 mg/dL (ref 0.0–0.3)
Total Bilirubin: 0.6 mg/dL (ref 0.2–1.2)
Total Protein: 7 g/dL (ref 6.0–8.3)

## 2018-09-11 LAB — LIPID PANEL
Cholesterol: 195 mg/dL (ref 0–200)
HDL: 71.7 mg/dL (ref >39.00)
LDL Cholesterol: 104 mg/dL — ABNORMAL HIGH (ref 0–99)
NonHDL: 123.67
Total CHOL/HDL Ratio: 3
Triglycerides: 97 mg/dL (ref 0.0–149.0)
VLDL: 19.4 mg/dL (ref 0.0–40.0)

## 2018-09-11 LAB — VITAMIN D 25 HYDROXY (VIT D DEFICIENCY, FRACTURES): VITD: 35.36 ng/mL (ref 30.00–100.00)

## 2018-09-11 LAB — TSH: TSH: 1.03 u[IU]/mL (ref 0.35–4.50)

## 2018-09-11 NOTE — Progress Notes (Signed)
   Subjective:    Patient ID: SOPHIAROSE EADES, female    DOB: [birth year], 78 y.o.   MRN: [id number]  HPI HTN- chronic problem, on Coreg 12.5mg  BID, Valsartan 40mg  BID w/ good control.  Denies CP, SOB, HAs, visual changes, edema.  Hyperlipidemia- pt reports myalgias are much improved since switching to Crestor 10mg  daily.  Denies abd pain, N/V.  Vit D Deficiency- chronic problem.  Taking Vit D 2000 units daily   Review of Systems For ROS see HPI     Objective:   Physical Exam Vitals signs reviewed.  Constitutional:      General: She is not in acute distress.    Appearance: She is well-developed.  HENT:     Head: Normocephalic and atraumatic.  Eyes:     Conjunctiva/sclera: Conjunctivae normal.     Pupils: Pupils are equal, round, and reactive to light.  Neck:     Musculoskeletal: Normal range of motion and neck supple.     Thyroid: No thyromegaly.  Cardiovascular:     Rate and Rhythm: Normal rate and regular rhythm.     Heart sounds: Normal heart sounds. No murmur.  Pulmonary:     Effort: Pulmonary effort is normal. No respiratory distress.     Breath sounds: Normal breath sounds.  Abdominal:     General: There is no distension.     Palpations: Abdomen is soft.     Tenderness: There is no abdominal tenderness.  Lymphadenopathy:     Cervical: No cervical adenopathy.  Skin:    General: Skin is warm and dry.  Neurological:     Mental Status: She is alert and oriented to person, place, and time.  Psychiatric:        Behavior: Behavior normal.           Assessment & Plan:

## 2018-09-11 NOTE — Assessment & Plan Note (Signed)
Check labs and replete prn. 

## 2018-09-11 NOTE — Patient Instructions (Signed)
Follow up in 6 months to recheck BP and cholesterol We'll notify you of your lab results and make any changes if needed Continue to work on healthy diet and regular exercise- you can do it! Call with any questions or concerns Stay Safe!

## 2018-09-11 NOTE — Assessment & Plan Note (Signed)
Chronic problem.  Better tolerance of Crestor.  Check labs.  Adjust meds prn

## 2018-09-11 NOTE — Assessment & Plan Note (Signed)
Chronic problem.  Well controlled.  Asymptomatic.  Check labs.  No anticipated med changes. 

## 2018-09-12 ENCOUNTER — Encounter: Payer: Self-pay | Admitting: General Practice

## 2018-10-16 DIAGNOSIS — I788 Other diseases of capillaries: Secondary | ICD-10-CM | POA: Diagnosis not present

## 2018-10-16 DIAGNOSIS — D225 Melanocytic nevi of trunk: Secondary | ICD-10-CM | POA: Diagnosis not present

## 2018-10-16 DIAGNOSIS — L821 Other seborrheic keratosis: Secondary | ICD-10-CM | POA: Diagnosis not present

## 2018-10-16 DIAGNOSIS — L738 Other specified follicular disorders: Secondary | ICD-10-CM | POA: Diagnosis not present

## 2018-10-16 DIAGNOSIS — L814 Other melanin hyperpigmentation: Secondary | ICD-10-CM | POA: Diagnosis not present

## 2018-10-16 DIAGNOSIS — L7211 Pilar cyst: Secondary | ICD-10-CM | POA: Diagnosis not present

## 2018-10-16 DIAGNOSIS — D1801 Hemangioma of skin and subcutaneous tissue: Secondary | ICD-10-CM | POA: Diagnosis not present

## 2018-12-05 DIAGNOSIS — L7211 Pilar cyst: Secondary | ICD-10-CM | POA: Diagnosis not present

## 2019-01-03 ENCOUNTER — Other Ambulatory Visit: Payer: Self-pay | Admitting: Family Medicine

## 2019-01-03 DIAGNOSIS — Z1231 Encounter for screening mammogram for malignant neoplasm of breast: Secondary | ICD-10-CM

## 2019-01-06 ENCOUNTER — Other Ambulatory Visit: Payer: Self-pay

## 2019-01-06 ENCOUNTER — Ambulatory Visit
Admission: RE | Admit: 2019-01-06 | Discharge: 2019-01-06 | Disposition: A | Discharge status: Home / Self Care | Payer: MEDICARE | Source: Ambulatory Visit | Attending: Family Medicine | Admitting: Family Medicine

## 2019-01-06 DIAGNOSIS — Z1231 Encounter for screening mammogram for malignant neoplasm of breast: Secondary | ICD-10-CM

## 2019-02-12 ENCOUNTER — Other Ambulatory Visit: Payer: Self-pay | Admitting: Family Medicine

## 2019-03-13 ENCOUNTER — Ambulatory Visit: Payer: MEDICARE | Admitting: Family Medicine

## 2019-05-16 ENCOUNTER — Other Ambulatory Visit (HOSPITAL_COMMUNITY): Payer: Self-pay | Admitting: Internal Medicine

## 2019-05-16 DIAGNOSIS — I10 Essential (primary) hypertension: Secondary | ICD-10-CM

## 2019-06-16 ENCOUNTER — Telehealth: Payer: Self-pay

## 2019-06-16 NOTE — Telephone Encounter (Signed)
Called and spoke with pt. She stated that she was concerned because she noticed in December that she has a smaller than a pea sized growth in her vaginal area. Pt wanted to know if Dr. Birdie Riddle could take a look at it to determine next best steps if any are needed. Pt also asked if since she hadn't been seen since June 2020 if she could possibly have her labs drawn for cholesterol if she came in fasting. I told pt I didn't think that would be an issue if she would like to come in fasting.   Pt was scheduled with PCP on Wednesday at 8:30am.

## 2019-06-16 NOTE — Telephone Encounter (Signed)
Patient called in stating she wanted to talk to PCP. Patient would not give any additional details. I informed patient that we are scheduling virtually at the providers discretion. Please advise, patient wants to come in office.

## 2019-06-16 NOTE — Telephone Encounter (Signed)
Please call pt and see if we can determine what she needs

## 2019-06-16 NOTE — Telephone Encounter (Signed)
FYI: Called patient to try and get more information. Patient does not wish to give any other information at this time and she states she will call back at a later date.

## 2019-06-16 NOTE — Telephone Encounter (Signed)
Please clarify- the pt wants to talk about coming into office for an upcoming appt but won't say what appt is for?  Or is there something pt needs to talk about now on the phone that she is not willing to disclose?  Unfortunately, I am full today and will not be able to call pt directly so she needs to elaborate to staff as to what we can do to help her

## 2019-06-18 ENCOUNTER — Ambulatory Visit (INDEPENDENT_AMBULATORY_CARE_PROVIDER_SITE_OTHER): Payer: MEDICARE | Admitting: Family Medicine

## 2019-06-18 ENCOUNTER — Other Ambulatory Visit: Payer: Self-pay

## 2019-06-18 ENCOUNTER — Encounter: Payer: Self-pay | Admitting: Family Medicine

## 2019-06-18 VITALS — BP 121/82 | HR 65 | Temp 97.9°F | Resp 16 | Ht 63.0 in | Wt 147.1 lb

## 2019-06-18 DIAGNOSIS — I428 Other cardiomyopathies: Secondary | ICD-10-CM | POA: Diagnosis not present

## 2019-06-18 DIAGNOSIS — E785 Hyperlipidemia, unspecified: Secondary | ICD-10-CM | POA: Diagnosis not present

## 2019-06-18 DIAGNOSIS — I10 Essential (primary) hypertension: Secondary | ICD-10-CM

## 2019-06-18 DIAGNOSIS — N750 Cyst of Bartholin's gland: Secondary | ICD-10-CM

## 2019-06-18 LAB — LIPID PANEL
Cholesterol: 197 mg/dL (ref 0–200)
HDL: 70.7 mg/dL (ref >39.00)
LDL Cholesterol: 105 mg/dL — ABNORMAL HIGH (ref 0–99)
NonHDL: 126.57
Total CHOL/HDL Ratio: 3
Triglycerides: 108 mg/dL (ref 0.0–149.0)
VLDL: 21.6 mg/dL (ref 0.0–40.0)

## 2019-06-18 LAB — HEPATIC FUNCTION PANEL
ALT: 15 U/L (ref 0–35)
AST: 17 U/L (ref 0–37)
Albumin: 4.4 g/dL (ref 3.5–5.2)
Alkaline Phosphatase: 62 U/L (ref 39–117)
Bilirubin, Direct: 0.1 mg/dL (ref 0.0–0.3)
Total Bilirubin: 0.5 mg/dL (ref 0.2–1.2)
Total Protein: 7.1 g/dL (ref 6.0–8.3)

## 2019-06-18 LAB — TSH: TSH: 1.69 u[IU]/mL (ref 0.35–4.50)

## 2019-06-18 LAB — BASIC METABOLIC PANEL
BUN: 17 mg/dL (ref 6–23)
CO2: 28 mEq/L (ref 19–32)
Calcium: 10 mg/dL (ref 8.4–10.5)
Chloride: 105 mEq/L (ref 96–112)
Creatinine, Ser: 0.89 mg/dL (ref 0.40–1.20)
GFR: 61.41 mL/min (ref >60.00)
Glucose, Bld: 111 mg/dL — ABNORMAL HIGH (ref 70–99)
Potassium: 4.2 mEq/L (ref 3.5–5.1)
Sodium: 140 mEq/L (ref 135–145)

## 2019-06-18 LAB — CBC WITH DIFFERENTIAL/PLATELET
Basophils Absolute: 0.1 10*3/uL (ref 0.0–0.1)
Basophils Relative: 1.2 % (ref 0.0–3.0)
Eosinophils Absolute: 0.3 10*3/uL (ref 0.0–0.7)
Eosinophils Relative: 6.6 % — ABNORMAL HIGH (ref 0.0–5.0)
HCT: 40.3 % (ref 36.0–46.0)
Hemoglobin: 13.2 g/dL (ref 12.0–15.0)
Lymphocytes Relative: 38.1 % (ref 12.0–46.0)
Lymphs Abs: 1.9 10*3/uL (ref 0.7–4.0)
MCHC: 32.8 g/dL (ref 30.0–36.0)
MCV: 89.4 fl (ref 78.0–100.0)
Monocytes Absolute: 0.4 10*3/uL (ref 0.1–1.0)
Monocytes Relative: 8.7 % (ref 3.0–12.0)
Neutro Abs: 2.3 10*3/uL (ref 1.4–7.7)
Neutrophils Relative %: 45.4 % (ref 43.0–77.0)
Platelets: 291 10*3/uL (ref 150.0–400.0)
RBC: 4.51 Mil/uL (ref 3.87–5.11)
RDW: 13.5 % (ref 11.5–15.5)
WBC: 5 10*3/uL (ref 4.0–10.5)

## 2019-06-18 NOTE — Assessment & Plan Note (Signed)
Chronic problem.  Well controlled today.  Asymptomatic.  Check labs.  No anticipated med changes.  Will follow. 

## 2019-06-18 NOTE — Progress Notes (Signed)
   Subjective:    Patient ID: Caitlyn Dunn, female    DOB: [birth year], 79 y.o.   MRN: [id number]  HPI HTN- chronic problem, on Coreg 12.5mg  BID and Valsartan 40mg  daily w/ excellent control.  No CP, SOB, HAs, visual changes, edema.  Hyperlipidemia- chronic problem, on Crestor 10mg  daily.  No abd pain, N/V.  CHF- following w/ cards.  On beta blocker, ARB.  Currently asymptomatic.  Vaginal lump- pt felt area when wiping.  Noticed in Dec/Jan.  Pt is unsure if area is enlarging.  No TTP.  No drainage.   Review of Systems For ROS see HPI   This visit occurred during the SARS-CoV-2 public health emergency.  Safety protocols were in place, including screening questions prior to the visit, additional usage of staff PPE, and extensive cleaning of exam room while observing appropriate contact time as indicated for disinfecting solutions.       Objective:   Physical Exam Vitals reviewed. Exam conducted with a chaperone present.  Constitutional:      General: She is not in acute distress.    Appearance: Normal appearance. She is well-developed.  HENT:     Head: Normocephalic and atraumatic.  Eyes:     Conjunctiva/sclera: Conjunctivae normal.     Pupils: Pupils are equal, round, and reactive to light.  Neck:     Thyroid: No thyromegaly.  Cardiovascular:     Rate and Rhythm: Normal rate and regular rhythm.     Heart sounds: Normal heart sounds. No murmur.  Pulmonary:     Effort: Pulmonary effort is normal. No respiratory distress.     Breath sounds: Normal breath sounds.  Abdominal:     General: There is no distension.     Palpations: Abdomen is soft.     Tenderness: There is no abdominal tenderness.  Genitourinary:    Pubic Area: No rash.      Labia:        Right: No rash, tenderness or lesion.        Left: Lesion (1 cm firm but mobile bartholin cyst w/o fluctuation or induration) present. No rash or tenderness.   Musculoskeletal:     Cervical back: Normal range of motion and  neck supple.  Lymphadenopathy:     Cervical: No cervical adenopathy.  Skin:    General: Skin is warm and dry.  Neurological:     Mental Status: She is alert and oriented to person, place, and time.  Psychiatric:        Behavior: Behavior normal.           Assessment & Plan:  Bartholin Cyst- reviewed dx w/ pt and since there is no pain at this time, will proceed w/ watchful waiting.  Reviewed supportive care and red flags that should prompt return.  Pt expressed understanding and is in agreement w/ plan.

## 2019-06-18 NOTE — Assessment & Plan Note (Signed)
Following w/ Cardiology.  Currently asymptomatic.  Will follow along.

## 2019-06-18 NOTE — Patient Instructions (Signed)
Follow up in 6 months to recheck BP and cholesterol- sooner if needed We'll notify you of your lab results and make any changes if needed Keep up the good work!  You look great! That area is a Bartholin Cyst and is benign.  If it starts to enlarge or change or become painful- please let me know and we can refer to GYN Call with any questions or concerns Stay Safe!  Stay Healthy!

## 2019-06-18 NOTE — Assessment & Plan Note (Signed)
Chronic problem.  Tolerating statin w/o difficulty.  Check labs.  Adjust meds prn  

## 2019-06-19 ENCOUNTER — Other Ambulatory Visit (INDEPENDENT_AMBULATORY_CARE_PROVIDER_SITE_OTHER): Payer: MEDICARE

## 2019-06-19 DIAGNOSIS — R7309 Other abnormal glucose: Secondary | ICD-10-CM

## 2019-06-19 LAB — HEMOGLOBIN A1C: Hgb A1c MFr Bld: 5.8 % (ref 4.6–6.5)

## 2019-06-19 NOTE — Progress Notes (Signed)
Called pt and lmovm to return call.

## 2019-07-31 ENCOUNTER — Telehealth: Payer: Self-pay | Admitting: Family Medicine

## 2019-07-31 NOTE — Telephone Encounter (Signed)
Spoke with patient she felt AWV not needed at this time.

## 2019-08-21 ENCOUNTER — Other Ambulatory Visit: Payer: Self-pay | Admitting: Family Medicine

## 2019-09-22 DIAGNOSIS — H2513 Age-related nuclear cataract, bilateral: Secondary | ICD-10-CM | POA: Diagnosis not present

## 2019-09-22 DIAGNOSIS — H43813 Vitreous degeneration, bilateral: Secondary | ICD-10-CM | POA: Diagnosis not present

## 2019-11-03 IMAGING — MG DIGITAL SCREENING BILATERAL MAMMOGRAM WITH TOMO AND CAD
8 series · 9 of 24 positions shown · non-contrast
Comparison: Previous exam(s).

CLINICAL DATA: Screening.

EXAM:
DIGITAL SCREENING BILATERAL MAMMOGRAM WITH TOMO AND CAD

[R MLO synth-2D]
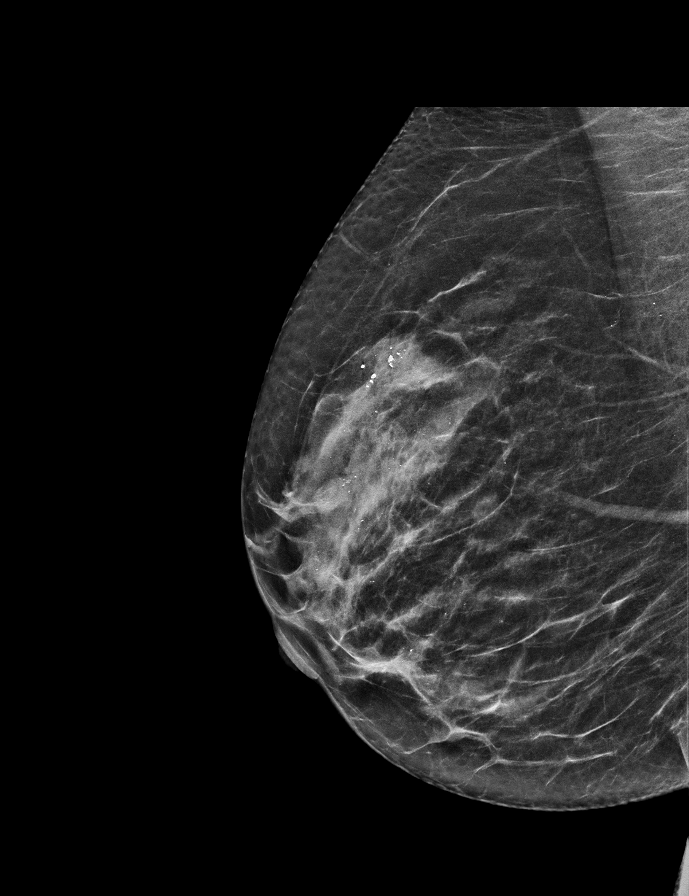

[R CC synth-2D]
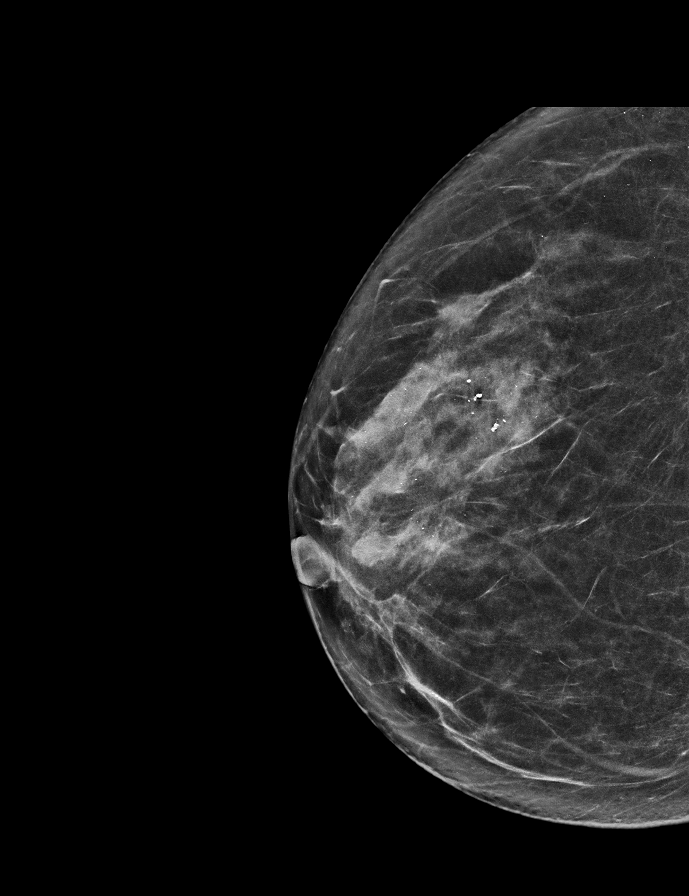

[L CC synth-2D]
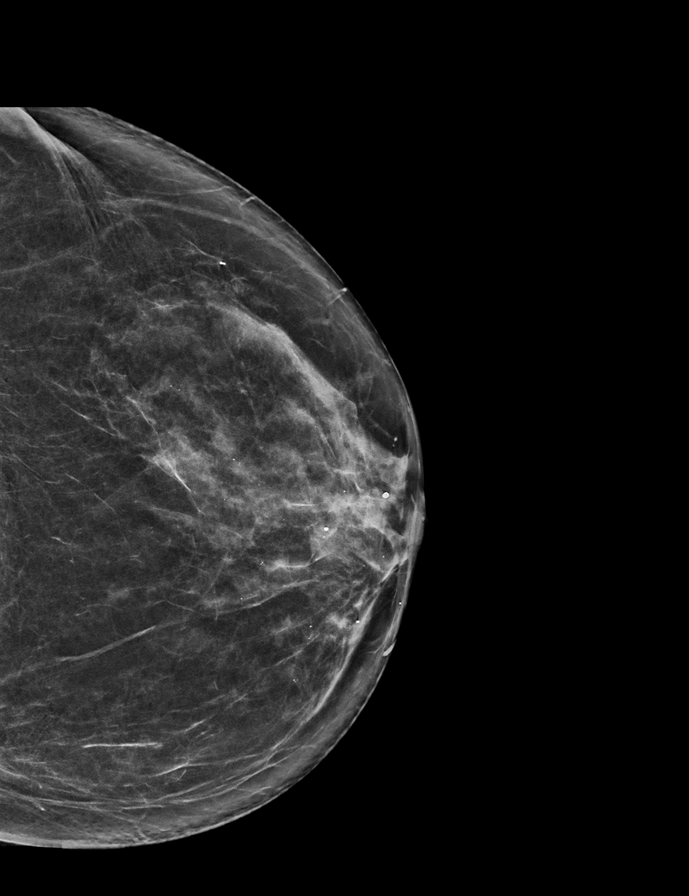

[L MLO synth-2D]
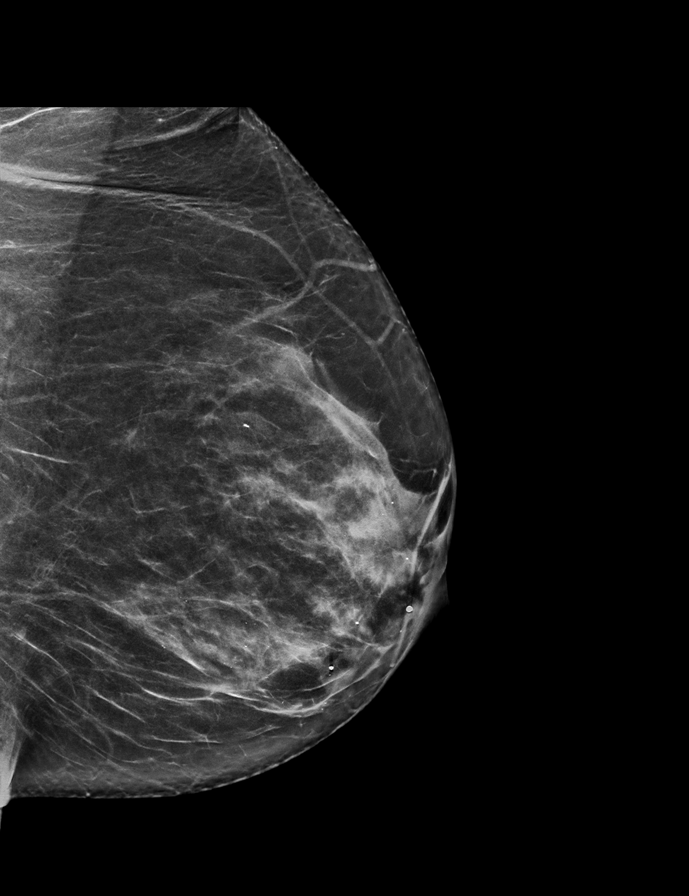

[L CC tomo · 2 of 61 frames shown]
[frame 20/61]
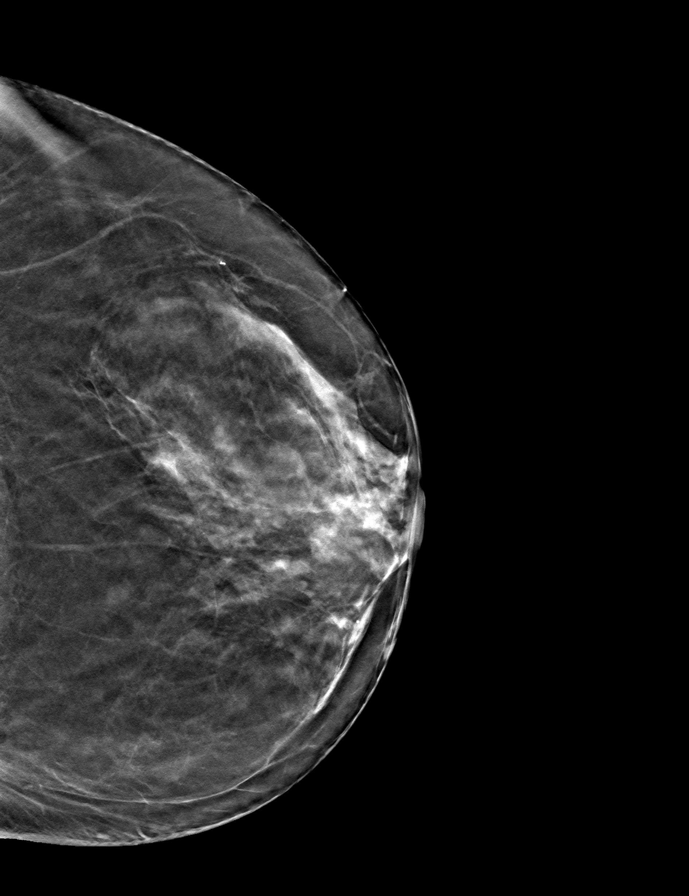
[frame 31/61]
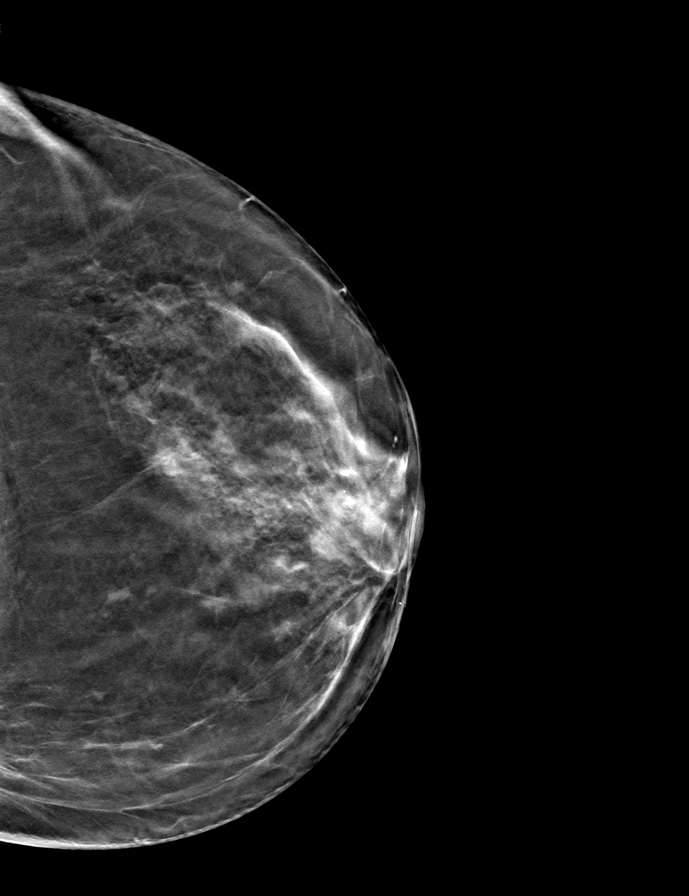

[R MLO tomo · tomo slice 30/59.0]
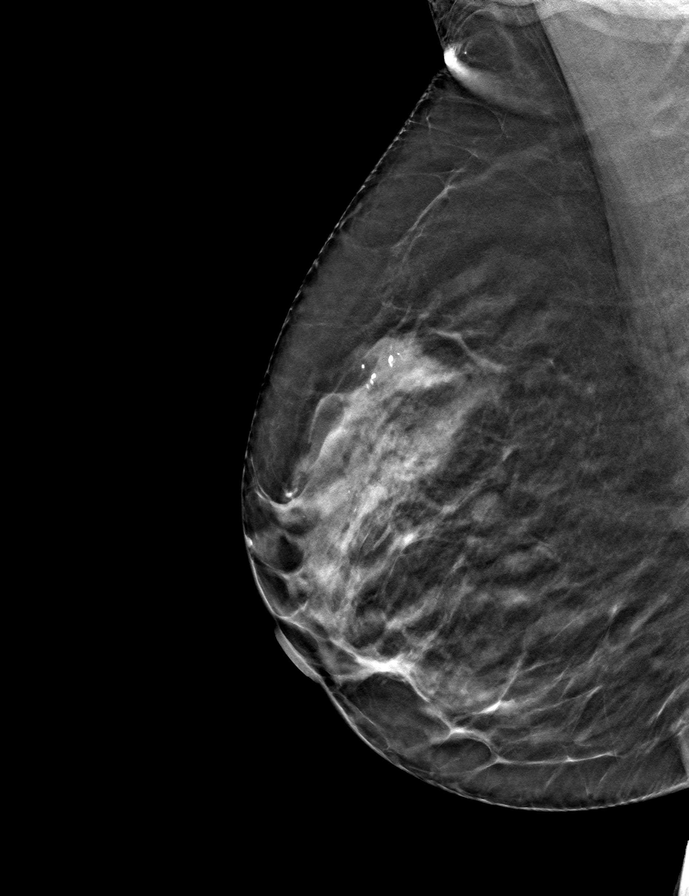

[R CC tomo · tomo slice 31/61.0]
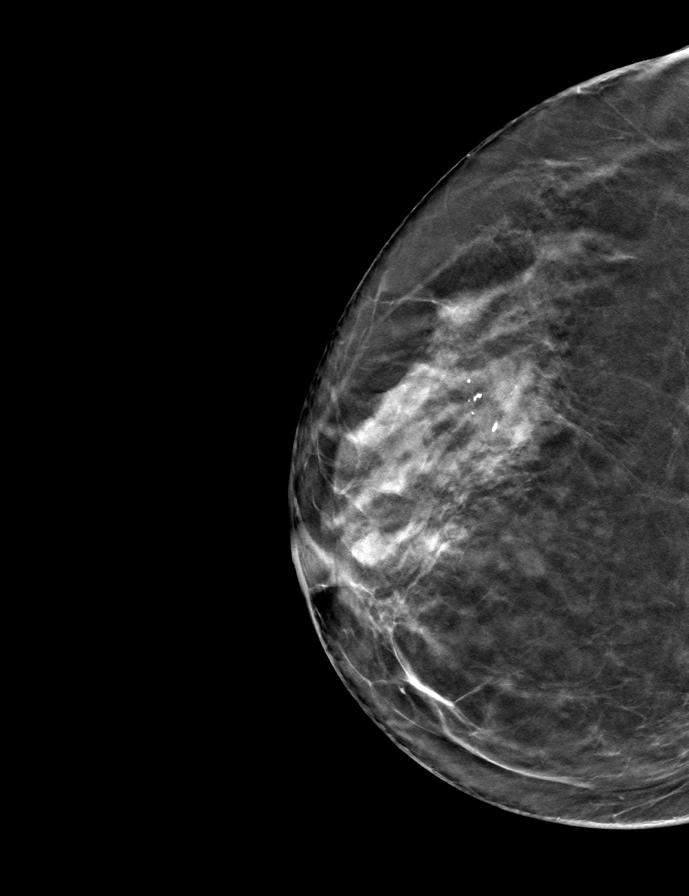

[L MLO tomo · tomo slice 33/66.0]
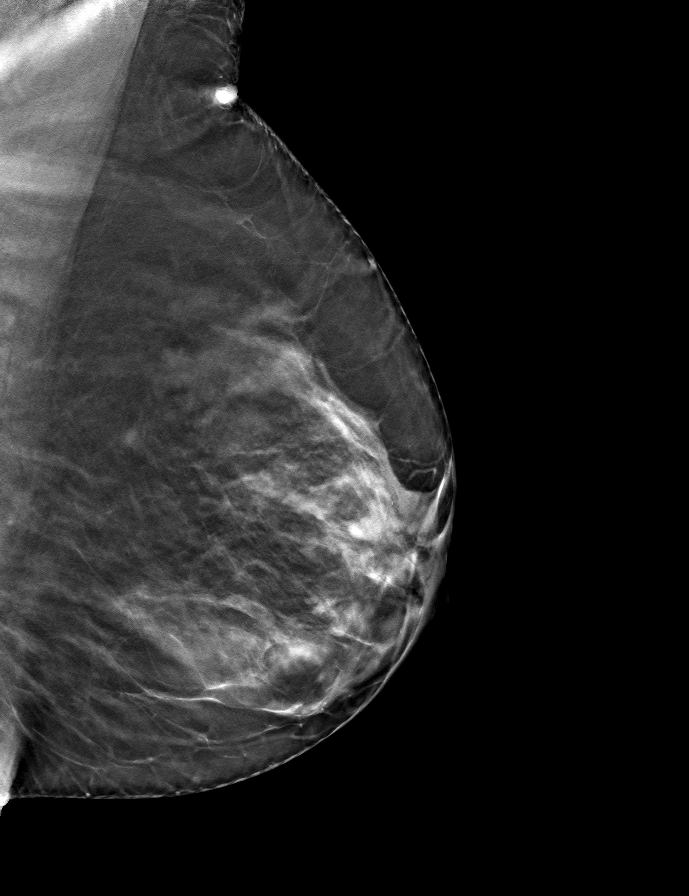

[9 of 24 positions shown; findings below may reference images not displayed]

ACR Breast Density Category c: The breast tissue is heterogeneously
dense, which may obscure small masses.
FINDINGS: There are no findings suspicious for malignancy. Images were
processed with CAD.
IMPRESSION: No mammographic evidence of malignancy. A result letter of this
screening mammogram will be mailed directly to the patient.

RECOMMENDATION:
Screening mammogram in one year. (Code:[id number])

BI-RADS CATEGORY  1: Negative.

## 2019-12-11 DIAGNOSIS — L720 Epidermal cyst: Secondary | ICD-10-CM | POA: Diagnosis not present

## 2019-12-11 DIAGNOSIS — L7211 Pilar cyst: Secondary | ICD-10-CM | POA: Diagnosis not present

## 2019-12-18 ENCOUNTER — Ambulatory Visit: Payer: MEDICARE | Admitting: Family Medicine

## 2019-12-18 DIAGNOSIS — Z0289 Encounter for other administrative examinations: Secondary | ICD-10-CM

## 2019-12-23 ENCOUNTER — Other Ambulatory Visit (HOSPITAL_COMMUNITY): Payer: Self-pay | Admitting: *Deleted

## 2019-12-23 ENCOUNTER — Other Ambulatory Visit: Payer: Self-pay

## 2019-12-23 ENCOUNTER — Ambulatory Visit (INDEPENDENT_AMBULATORY_CARE_PROVIDER_SITE_OTHER): Payer: MEDICARE | Admitting: Family Medicine

## 2019-12-23 ENCOUNTER — Encounter: Payer: Self-pay | Admitting: Family Medicine

## 2019-12-23 VITALS — BP 120/79 | HR 76 | Temp 98.0°F | Resp 16 | Ht 63.0 in | Wt 146.1 lb

## 2019-12-23 DIAGNOSIS — I10 Essential (primary) hypertension: Secondary | ICD-10-CM

## 2019-12-23 DIAGNOSIS — Z9889 Other specified postprocedural states: Secondary | ICD-10-CM

## 2019-12-23 DIAGNOSIS — M8580 Other specified disorders of bone density and structure, unspecified site: Secondary | ICD-10-CM | POA: Diagnosis not present

## 2019-12-23 DIAGNOSIS — Z23 Encounter for immunization: Secondary | ICD-10-CM | POA: Diagnosis not present

## 2019-12-23 DIAGNOSIS — E785 Hyperlipidemia, unspecified: Secondary | ICD-10-CM

## 2019-12-23 LAB — BASIC METABOLIC PANEL
BUN: 19 mg/dL (ref 6–23)
CO2: 27 mEq/L (ref 19–32)
Calcium: 10.1 mg/dL (ref 8.4–10.5)
Chloride: 103 mEq/L (ref 96–112)
Creatinine, Ser: 0.88 mg/dL (ref 0.40–1.20)
GFR: 62.13 mL/min (ref >60.00)
Glucose, Bld: 95 mg/dL (ref 70–99)
Potassium: 4.6 mEq/L (ref 3.5–5.1)
Sodium: 139 mEq/L (ref 135–145)

## 2019-12-23 LAB — VITAMIN D 25 HYDROXY (VIT D DEFICIENCY, FRACTURES): VITD: 30.6 ng/mL (ref 30.00–100.00)

## 2019-12-23 LAB — HEPATIC FUNCTION PANEL
ALT: 11 U/L (ref 0–35)
AST: 15 U/L (ref 0–37)
Albumin: 4.5 g/dL (ref 3.5–5.2)
Alkaline Phosphatase: 64 U/L (ref 39–117)
Bilirubin, Direct: 0.1 mg/dL (ref 0.0–0.3)
Total Bilirubin: 0.6 mg/dL (ref 0.2–1.2)
Total Protein: 7.2 g/dL (ref 6.0–8.3)

## 2019-12-23 LAB — CBC WITH DIFFERENTIAL/PLATELET
Basophils Absolute: 0.1 10*3/uL (ref 0.0–0.1)
Basophils Relative: 0.9 % (ref 0.0–3.0)
Eosinophils Absolute: 0.3 10*3/uL (ref 0.0–0.7)
Eosinophils Relative: 4.8 % (ref 0.0–5.0)
HCT: 40.2 % (ref 36.0–46.0)
Hemoglobin: 13.2 g/dL (ref 12.0–15.0)
Lymphocytes Relative: 31.3 % (ref 12.0–46.0)
Lymphs Abs: 1.7 10*3/uL (ref 0.7–4.0)
MCHC: 32.7 g/dL (ref 30.0–36.0)
MCV: 89.6 fl (ref 78.0–100.0)
Monocytes Absolute: 0.5 10*3/uL (ref 0.1–1.0)
Monocytes Relative: 9.7 % (ref 3.0–12.0)
Neutro Abs: 2.9 10*3/uL (ref 1.4–7.7)
Neutrophils Relative %: 53.3 % (ref 43.0–77.0)
Platelets: 308 10*3/uL (ref 150.0–400.0)
RBC: 4.49 Mil/uL (ref 3.87–5.11)
RDW: 13.5 % (ref 11.5–15.5)
WBC: 5.5 10*3/uL (ref 4.0–10.5)

## 2019-12-23 LAB — LIPID PANEL
Cholesterol: 173 mg/dL (ref 0–200)
HDL: 67.3 mg/dL (ref >39.00)
LDL Cholesterol: 86 mg/dL (ref 0–99)
NonHDL: 105.22
Total CHOL/HDL Ratio: 3
Triglycerides: 97 mg/dL (ref 0.0–149.0)
VLDL: 19.4 mg/dL (ref 0.0–40.0)

## 2019-12-23 LAB — TSH: TSH: 1.13 u[IU]/mL (ref 0.35–4.50)

## 2019-12-23 MED ORDER — CLINDAMYCIN HCL 150 MG PO CAPS
ORAL_CAPSULE | ORAL | 2 refills | Status: DC
Start: 2019-12-23 — End: 2021-09-15

## 2019-12-23 NOTE — Patient Instructions (Signed)
Follow up in 6 months to recheck BP and cholesterol We'll notify you of your lab results and make any changes if needed Keep up the good work on healthy diet and regular physical activity- you look great! Call and schedule w/ Dr Linna Hoff Call with any questions or concerns Stay Safe!  Stay Healthy!

## 2019-12-23 NOTE — Progress Notes (Signed)
° °  Subjective:    Patient ID: Caitlyn Dunn, female    DOB: [birth year], 79 y.o.   MRN: [id number]  HPI HTN- chronic problem, on Coreg 12.5mg  BID and Diovan 40mg  BID w/ good control.  Pt reports feeling good.  No CP, SOB, HAs, visual changes, edema.  Hyperlipidemia- chronic problem, on Crestor 10mg  daily.  Denies abd pain w/ exception of episode of abd cramping last week.  Osteopenia- pt has declined DEXAs recently.  Pt was apparently told that she doesn't need another and pt agrees w/ this   Review of Systems For ROS see HPI   This visit occurred during the SARS-CoV-2 public health emergency.  Safety protocols were in place, including screening questions prior to the visit, additional usage of staff PPE, and extensive cleaning of exam room while observing appropriate contact time as indicated for disinfecting solutions.       Objective:   Physical Exam Vitals reviewed.  Constitutional:      General: She is not in acute distress.    Appearance: Normal appearance. She is well-developed.  HENT:     Head: Normocephalic and atraumatic.  Eyes:     Conjunctiva/sclera: Conjunctivae normal.     Pupils: Pupils are equal, round, and reactive to light.  Neck:     Thyroid: No thyromegaly.  Cardiovascular:     Rate and Rhythm: Normal rate and regular rhythm.     Heart sounds: Normal heart sounds. No murmur heard.   Pulmonary:     Effort: Pulmonary effort is normal. No respiratory distress.     Breath sounds: Normal breath sounds.  Abdominal:     General: There is no distension.     Palpations: Abdomen is soft.     Tenderness: There is no abdominal tenderness.  Musculoskeletal:     Cervical back: Normal range of motion and neck supple.  Lymphadenopathy:     Cervical: No cervical adenopathy.  Skin:    General: Skin is warm and dry.  Neurological:     Mental Status: She is alert and oriented to person, place, and time.  Psychiatric:        Behavior: Behavior normal.             Assessment & Plan:

## 2019-12-23 NOTE — Assessment & Plan Note (Signed)
Chronic problem.  On Coreg 12.5mg  BID and Diovan 40mg  BID w/ good control.  Currently asymptomatic.  Check labs.  No anticipated med changes.

## 2019-12-23 NOTE — Assessment & Plan Note (Signed)
Chronic problem.  Pt is on Crestor 10mg  daily.  Currently asymptomatic (after 1 episode of abdominal cramping last week).  Check labs.  Adjust meds prn

## 2019-12-24 ENCOUNTER — Encounter: Payer: Self-pay | Admitting: General Practice

## 2020-02-06 ENCOUNTER — Ambulatory Visit (INDEPENDENT_AMBULATORY_CARE_PROVIDER_SITE_OTHER): Payer: MEDICARE

## 2020-02-06 ENCOUNTER — Other Ambulatory Visit: Payer: Self-pay

## 2020-02-06 ENCOUNTER — Ambulatory Visit (INDEPENDENT_AMBULATORY_CARE_PROVIDER_SITE_OTHER): Payer: MEDICARE | Admitting: Podiatry

## 2020-02-06 DIAGNOSIS — S92351A Displaced fracture of fifth metatarsal bone, right foot, initial encounter for closed fracture: Secondary | ICD-10-CM | POA: Diagnosis not present

## 2020-02-06 DIAGNOSIS — S99921A Unspecified injury of right foot, initial encounter: Secondary | ICD-10-CM | POA: Diagnosis not present

## 2020-02-06 DIAGNOSIS — M79671 Pain in right foot: Secondary | ICD-10-CM

## 2020-02-06 DIAGNOSIS — S9031XA Contusion of right foot, initial encounter: Secondary | ICD-10-CM

## 2020-02-06 DIAGNOSIS — S93601A Unspecified sprain of right foot, initial encounter: Secondary | ICD-10-CM

## 2020-02-06 MED ORDER — VITAMIN D (ERGOCALCIFEROL) 1.25 MG (50000 UNIT) PO CAPS: 50000.0000 [IU] | ORAL_CAPSULE | ORAL | 0 refills | Status: DC

## 2020-02-06 NOTE — Progress Notes (Signed)
  Subjective:  Patient ID: Caitlyn Dunn, female    DOB: 07-12-1940,  MRN: [id number]  Chief Complaint  Patient presents with  . Foot Injury    3wk ago- pt twisited ankle/ foot- had a fallen- mentioned she is stll having pain when walking- further evlaution     79 y.o. female presents with the above complaint. History confirmed with patient. States her foot remained planted during injury, did not feel pop or break.  Objective:  Physical Exam: warm, good capillary refill, no trophic changes or ulcerative lesions, normal DP and PT pulses and normal sensory exam.  Right Foot: POP right lateral foot, maximal at the 5th met base. No crepitus noted. No brusing/contusion.   No images are attached to the encounter.  Radiographs: X-ray of the right foot: fracture right 5th metatarsal base, intra-articular. Nondisplaced no angulation. Assessment:   1. Closed fracture of base of fifth metatarsal bone, right, initial encounter   2. Sprain of right foot, initial encounter   3. Pain of right foot   4. Injury of right foot, initial encounter      Plan:  Patient was evaluated and treated and all questions answered.  Right 5th met fracture -XR Reviewed with patient -Would benefit from trial of non-operative management for this injury. -WBAT in CAM Boot -CAM boot dispensed- -reviewed Vit D, would benefit from supplement. Rx 50k units weekly.  Return in about 4 weeks (around 03/05/2020) for right 5th metatarsal fracture f/u.

## 2020-03-01 ENCOUNTER — Other Ambulatory Visit: Payer: Self-pay | Admitting: Family Medicine

## 2020-03-08 ENCOUNTER — Telehealth: Payer: Self-pay | Admitting: Family Medicine

## 2020-03-08 NOTE — Telephone Encounter (Signed)
Patient called stating that she received a no show for 12/18/2019 but she had a fever and called and was told she could not be seen in office.  I cancelled this appointment, but I don't remember if at the time she told me this or not.  She probably did, seems like a very nice lady.  She was seen the next week by Dr. Birdie Riddle.  Is there anything we can do to have this removed from her bill?  Please advise.

## 2020-03-09 ENCOUNTER — Ambulatory Visit (INDEPENDENT_AMBULATORY_CARE_PROVIDER_SITE_OTHER): Payer: MEDICARE | Admitting: Podiatry

## 2020-03-09 ENCOUNTER — Other Ambulatory Visit: Payer: Self-pay

## 2020-03-09 ENCOUNTER — Other Ambulatory Visit: Payer: Self-pay | Admitting: Podiatry

## 2020-03-09 ENCOUNTER — Ambulatory Visit (INDEPENDENT_AMBULATORY_CARE_PROVIDER_SITE_OTHER): Payer: MEDICARE

## 2020-03-09 DIAGNOSIS — S92354D Nondisplaced fracture of fifth metatarsal bone, right foot, subsequent encounter for fracture with routine healing: Secondary | ICD-10-CM

## 2020-03-09 DIAGNOSIS — S92351A Displaced fracture of fifth metatarsal bone, right foot, initial encounter for closed fracture: Secondary | ICD-10-CM

## 2020-03-09 NOTE — Progress Notes (Signed)
  Subjective:  Patient ID: Caitlyn Dunn, female    DOB: 02/25/1941,  MRN: [id number]  Chief Complaint  Patient presents with  . Routine Post Op    Pt states no pain at injury site but is experiencing right dorsal foot pain due to what she states is fatigue from wearing her boot.     79 y.o. female presents with the above complaint. History confirmed with patient.  Objective:  Physical Exam: warm, good capillary refill, no trophic changes or ulcerative lesions, normal DP and PT pulses and normal sensory exam.  Right Foot: POP right lateral foot, maximal at the 5th met base. No crepitus noted. No brusing/contusion.   No images are attached to the encounter.  Radiographs: X-ray of the right foot: fracture right 5th metatarsal base, intra-articular. Nondisplaced no angulation. Progressive healing Assessment:   1. Closed fracture of base of fifth metatarsal bone, right, initial encounter      Plan:  Patient was evaluated and treated and all questions answered.  Right 5th met fracture -XR Reviewed with patient -WBAT in CAM Boot -CAM boot dispensed- previous was improperly sized  Return in about 3 weeks (around 03/30/2020) for fracture f/u with XRs.

## 2020-03-11 NOTE — Telephone Encounter (Signed)
Sent email to charge correction to void. 

## 2020-03-26 ENCOUNTER — Ambulatory Visit (INDEPENDENT_AMBULATORY_CARE_PROVIDER_SITE_OTHER): Payer: MEDICARE | Admitting: Podiatry

## 2020-03-26 ENCOUNTER — Ambulatory Visit (INDEPENDENT_AMBULATORY_CARE_PROVIDER_SITE_OTHER): Payer: MEDICARE

## 2020-03-26 ENCOUNTER — Other Ambulatory Visit: Payer: Self-pay

## 2020-03-26 DIAGNOSIS — S92301D Fracture of unspecified metatarsal bone(s), right foot, subsequent encounter for fracture with routine healing: Secondary | ICD-10-CM

## 2020-03-26 DIAGNOSIS — S92351A Displaced fracture of fifth metatarsal bone, right foot, initial encounter for closed fracture: Secondary | ICD-10-CM

## 2020-03-26 NOTE — Progress Notes (Signed)
  Subjective:  Patient ID: Caitlyn Dunn, female    DOB: Apr 02, 1941,  MRN: [id number]  Chief Complaint  Patient presents with  . Foot Injury    Pt states she has no pain and is doing better. Pt does state concern with her boot and is afraid she may fall if she continues to wear it, would like to switch to something else.    79 y.o. female presents with the above complaint. History confirmed with patient.  Objective:  Physical Exam: warm, good capillary refill, no trophic changes or ulcerative lesions, normal DP and PT pulses and normal sensory exam.  Right Foot: POP right lateral foot, maximal at the 5th met base. No crepitus noted. No brusing/contusion.   No images are attached to the encounter.  Radiographs: X-ray of the right foot: fracture right 5th metatarsal base, intra-articular. Nondisplaced no angulation. Progressive healing Assessment:   1. Closed fracture of base of metatarsal bone of right foot with routine healing, subsequent encounter      Plan:  Patient was evaluated and treated and all questions answered.  Right 5th met fracture -XR reviewed. Appears healed with some inferior gapping. -Dispense trilock brace. Patient to wear for 2 weeks then transition to normal shoegear.  Return if symptoms worsen or fail to improve.

## 2020-03-30 ENCOUNTER — Other Ambulatory Visit: Payer: Self-pay | Admitting: Podiatry

## 2020-03-30 DIAGNOSIS — S92301D Fracture of unspecified metatarsal bone(s), right foot, subsequent encounter for fracture with routine healing: Secondary | ICD-10-CM

## 2020-05-27 ENCOUNTER — Other Ambulatory Visit (HOSPITAL_COMMUNITY): Payer: Self-pay | Admitting: Internal Medicine

## 2020-05-27 DIAGNOSIS — I10 Essential (primary) hypertension: Secondary | ICD-10-CM

## 2020-06-23 ENCOUNTER — Other Ambulatory Visit: Payer: Self-pay

## 2020-06-23 ENCOUNTER — Encounter: Payer: Self-pay | Admitting: Family Medicine

## 2020-06-23 ENCOUNTER — Ambulatory Visit (INDEPENDENT_AMBULATORY_CARE_PROVIDER_SITE_OTHER): Payer: MEDICARE | Admitting: Family Medicine

## 2020-06-23 VITALS — BP 132/80 | HR 67 | Temp 96.9°F | Resp 19 | Ht 63.0 in | Wt 148.0 lb

## 2020-06-23 DIAGNOSIS — I10 Essential (primary) hypertension: Secondary | ICD-10-CM

## 2020-06-23 DIAGNOSIS — E785 Hyperlipidemia, unspecified: Secondary | ICD-10-CM

## 2020-06-23 DIAGNOSIS — I5022 Chronic systolic (congestive) heart failure: Secondary | ICD-10-CM

## 2020-06-23 LAB — BASIC METABOLIC PANEL
BUN: 18 mg/dL (ref 6–23)
CO2: 27 mEq/L (ref 19–32)
Calcium: 10.3 mg/dL (ref 8.4–10.5)
Chloride: 103 mEq/L (ref 96–112)
Creatinine, Ser: 0.92 mg/dL (ref 0.40–1.20)
GFR: 59.6 mL/min — ABNORMAL LOW (ref >60.00)
Glucose, Bld: 100 mg/dL — ABNORMAL HIGH (ref 70–99)
Potassium: 4.3 mEq/L (ref 3.5–5.1)
Sodium: 137 mEq/L (ref 135–145)

## 2020-06-23 LAB — CBC WITH DIFFERENTIAL/PLATELET
Basophils Absolute: 0.1 10*3/uL (ref 0.0–0.1)
Basophils Relative: 1.3 % (ref 0.0–3.0)
Eosinophils Absolute: 0.4 10*3/uL (ref 0.0–0.7)
Eosinophils Relative: 7 % — ABNORMAL HIGH (ref 0.0–5.0)
HCT: 39.5 % (ref 36.0–46.0)
Hemoglobin: 13.2 g/dL (ref 12.0–15.0)
Lymphocytes Relative: 33.4 % (ref 12.0–46.0)
Lymphs Abs: 1.7 10*3/uL (ref 0.7–4.0)
MCHC: 33.3 g/dL (ref 30.0–36.0)
MCV: 87.7 fl (ref 78.0–100.0)
Monocytes Absolute: 0.6 10*3/uL (ref 0.1–1.0)
Monocytes Relative: 11.4 % (ref 3.0–12.0)
Neutro Abs: 2.5 10*3/uL (ref 1.4–7.7)
Neutrophils Relative %: 46.9 % (ref 43.0–77.0)
Platelets: 265 10*3/uL (ref 150.0–400.0)
RBC: 4.51 Mil/uL (ref 3.87–5.11)
RDW: 13.7 % (ref 11.5–15.5)
WBC: 5.2 10*3/uL (ref 4.0–10.5)

## 2020-06-23 LAB — BRAIN NATRIURETIC PEPTIDE: Pro B Natriuretic peptide (BNP): 66 pg/mL (ref 0.0–100.0)

## 2020-06-23 LAB — LIPID PANEL
Cholesterol: 192 mg/dL (ref 0–200)
HDL: 71.3 mg/dL (ref >39.00)
LDL Cholesterol: 100 mg/dL — ABNORMAL HIGH (ref 0–99)
NonHDL: 120.3
Total CHOL/HDL Ratio: 3
Triglycerides: 104 mg/dL (ref 0.0–149.0)
VLDL: 20.8 mg/dL (ref 0.0–40.0)

## 2020-06-23 LAB — HEPATIC FUNCTION PANEL
ALT: 14 U/L (ref 0–35)
AST: 19 U/L (ref 0–37)
Albumin: 4.4 g/dL (ref 3.5–5.2)
Alkaline Phosphatase: 68 U/L (ref 39–117)
Bilirubin, Direct: 0.1 mg/dL (ref 0.0–0.3)
Total Bilirubin: 0.7 mg/dL (ref 0.2–1.2)
Total Protein: 7.4 g/dL (ref 6.0–8.3)

## 2020-06-23 LAB — TSH: TSH: 1.29 u[IU]/mL (ref 0.35–4.50)

## 2020-06-23 NOTE — Assessment & Plan Note (Signed)
Chronic problem.  Tolerating Crestor 10mg daily w/o difficulty.  Check labs.  Adjust meds prn  

## 2020-06-23 NOTE — Assessment & Plan Note (Signed)
Chronic problem.  Adequate control today on Coreg 12.5mg  BID and Valsartan 40mg  BID.  Currently asymptomatic.  Check labs.  No anticipated med changes.

## 2020-06-23 NOTE — Progress Notes (Signed)
   Subjective:    Patient ID: Caitlyn Dunn, female    DOB: [birth year], 80 y.o.   MRN: [id number]  HPI HTN- chronic problem, on Coreg 12.5mg  BID and Valsartan 40mg  BID  Denies CP, SOB, HAs, visual changes, edema.  Hyperlipidemia- chronic problem, on Crestor 10mg  daily.  No abd pain, N/V.  CHF- chronic problem for pt.  Overdue for Cardiology appt.  On ASA, beta blocker, ARB.  Pt reports she is due for repeat ECHO.  She called to schedule but never heard back.   Review of Systems For ROS see HPI   This visit occurred during the SARS-CoV-2 public health emergency.  Safety protocols were in place, including screening questions prior to the visit, additional usage of staff PPE, and extensive cleaning of exam room while observing appropriate contact time as indicated for disinfecting solutions.       Objective:   Physical Exam Vitals reviewed.  Constitutional:      General: She is not in acute distress.    Appearance: Normal appearance. She is well-developed. She is not ill-appearing.  HENT:     Head: Normocephalic and atraumatic.  Eyes:     Conjunctiva/sclera: Conjunctivae normal.     Pupils: Pupils are equal, round, and reactive to light.  Neck:     Thyroid: No thyromegaly.  Cardiovascular:     Rate and Rhythm: Normal rate and regular rhythm.     Pulses: Normal pulses.     Heart sounds: Normal heart sounds. No murmur heard.   Pulmonary:     Effort: Pulmonary effort is normal. No respiratory distress.     Breath sounds: Normal breath sounds.  Abdominal:     General: There is no distension.     Palpations: Abdomen is soft.     Tenderness: There is no abdominal tenderness.  Musculoskeletal:     Cervical back: Normal range of motion and neck supple.     Right lower leg: No edema.     Left lower leg: No edema.  Lymphadenopathy:     Cervical: No cervical adenopathy.  Skin:    General: Skin is warm and dry.  Neurological:     Mental Status: She is alert and oriented to  person, place, and time.  Psychiatric:        Behavior: Behavior normal.           Assessment & Plan:

## 2020-06-23 NOTE — Assessment & Plan Note (Signed)
Ongoing issue for pt.  She is overdue for cardiology appt but states that she called to schedule and never received a call back.  Will forward today's note to them to assist w/ scheduling

## 2020-06-23 NOTE — Patient Instructions (Addendum)
Follow up in 6 months to recheck BP and cholesterol We'll notify you of your lab results and make any changes if needed Continue to work on healthy diet and regular exercise- you're doing great! Call with any questions or concerns Schedule your mammogram at your convenience I strongly encourage the booster shot Stay Safe!  Stay Healthy!

## 2020-08-26 ENCOUNTER — Other Ambulatory Visit (HOSPITAL_COMMUNITY): Payer: Self-pay | Admitting: Internal Medicine

## 2020-08-26 ENCOUNTER — Other Ambulatory Visit: Payer: Self-pay | Admitting: Family Medicine

## 2020-08-26 DIAGNOSIS — I10 Essential (primary) hypertension: Secondary | ICD-10-CM

## 2020-10-06 ENCOUNTER — Encounter: Payer: Self-pay | Admitting: *Deleted

## 2020-10-08 ENCOUNTER — Other Ambulatory Visit (HOSPITAL_COMMUNITY): Payer: Self-pay | Admitting: Internal Medicine

## 2020-10-08 DIAGNOSIS — I10 Essential (primary) hypertension: Secondary | ICD-10-CM

## 2020-10-21 DIAGNOSIS — L720 Epidermal cyst: Secondary | ICD-10-CM | POA: Diagnosis not present

## 2020-10-21 DIAGNOSIS — D225 Melanocytic nevi of trunk: Secondary | ICD-10-CM | POA: Diagnosis not present

## 2020-10-21 DIAGNOSIS — L7211 Pilar cyst: Secondary | ICD-10-CM | POA: Diagnosis not present

## 2020-10-21 DIAGNOSIS — D1801 Hemangioma of skin and subcutaneous tissue: Secondary | ICD-10-CM | POA: Diagnosis not present

## 2020-10-21 DIAGNOSIS — L821 Other seborrheic keratosis: Secondary | ICD-10-CM | POA: Diagnosis not present

## 2020-11-01 ENCOUNTER — Other Ambulatory Visit (HOSPITAL_COMMUNITY): Payer: Self-pay | Admitting: Cardiology

## 2020-11-01 DIAGNOSIS — I10 Essential (primary) hypertension: Secondary | ICD-10-CM

## 2020-11-16 ENCOUNTER — Other Ambulatory Visit (HOSPITAL_COMMUNITY): Payer: Self-pay | Admitting: Cardiology

## 2020-11-16 DIAGNOSIS — I10 Essential (primary) hypertension: Secondary | ICD-10-CM

## 2020-11-17 ENCOUNTER — Other Ambulatory Visit (HOSPITAL_COMMUNITY): Payer: Self-pay

## 2020-11-17 DIAGNOSIS — Z9889 Other specified postprocedural states: Secondary | ICD-10-CM

## 2020-11-17 NOTE — Progress Notes (Signed)
Orders Placed This Encounter  Procedures   ECHOCARDIOGRAM COMPLETE    Standing Status:   Future    Standing Expiration Date:   11/17/2021    Order Specific Question:   Where should this test be performed    Answer:   [place]    Order Specific Question:   Perflutren DEFINITY (image enhancing agent) should be administered unless hypersensitivity or allergy exist    Answer:   Administer Perflutren    Order Specific Question:   Reason for exam-Echo    Answer:   Congestive Heart Failure  I50.9    Order Specific Question:   Release to patient    Answer:   Immediate

## 2020-11-18 ENCOUNTER — Ambulatory Visit (HOSPITAL_BASED_OUTPATIENT_CLINIC_OR_DEPARTMENT_OTHER)
Admission: RE | Admit: 2020-11-18 | Discharge: 2020-11-18 | Disposition: A | Discharge status: Home / Self Care | Payer: MEDICARE | Source: Ambulatory Visit | Attending: Internal Medicine | Admitting: Internal Medicine

## 2020-11-18 ENCOUNTER — Encounter (HOSPITAL_COMMUNITY): Payer: Self-pay | Admitting: Internal Medicine

## 2020-11-18 ENCOUNTER — Ambulatory Visit (HOSPITAL_COMMUNITY)
Admission: RE | Admit: 2020-11-18 | Discharge: 2020-11-18 | Disposition: A | Discharge status: Home / Self Care | Payer: MEDICARE | Source: Ambulatory Visit | Attending: Internal Medicine | Admitting: Internal Medicine

## 2020-11-18 ENCOUNTER — Other Ambulatory Visit: Payer: Self-pay

## 2020-11-18 VITALS — BP 138/77 | HR 64 | Wt 148.4 lb

## 2020-11-18 DIAGNOSIS — I080 Rheumatic disorders of both mitral and aortic valves: Secondary | ICD-10-CM | POA: Diagnosis not present

## 2020-11-18 DIAGNOSIS — Z86010 Personal history of colonic polyps: Secondary | ICD-10-CM | POA: Diagnosis not present

## 2020-11-18 DIAGNOSIS — Z79899 Other long term (current) drug therapy: Secondary | ICD-10-CM | POA: Diagnosis not present

## 2020-11-18 DIAGNOSIS — E785 Hyperlipidemia, unspecified: Secondary | ICD-10-CM | POA: Diagnosis not present

## 2020-11-18 DIAGNOSIS — I6521 Occlusion and stenosis of right carotid artery: Secondary | ICD-10-CM | POA: Insufficient documentation

## 2020-11-18 DIAGNOSIS — I428 Other cardiomyopathies: Secondary | ICD-10-CM | POA: Insufficient documentation

## 2020-11-18 DIAGNOSIS — Z7982 Long term (current) use of aspirin: Secondary | ICD-10-CM | POA: Diagnosis not present

## 2020-11-18 DIAGNOSIS — Z9889 Other specified postprocedural states: Secondary | ICD-10-CM

## 2020-11-18 DIAGNOSIS — I5022 Chronic systolic (congestive) heart failure: Secondary | ICD-10-CM | POA: Diagnosis not present

## 2020-11-18 DIAGNOSIS — Z954 Presence of other heart-valve replacement: Secondary | ICD-10-CM | POA: Insufficient documentation

## 2020-11-18 DIAGNOSIS — I110 Hypertensive heart disease with heart failure: Secondary | ICD-10-CM | POA: Insufficient documentation

## 2020-11-18 DIAGNOSIS — I509 Heart failure, unspecified: Secondary | ICD-10-CM | POA: Diagnosis not present

## 2020-11-18 DIAGNOSIS — Z8249 Family history of ischemic heart disease and other diseases of the circulatory system: Secondary | ICD-10-CM | POA: Insufficient documentation

## 2020-11-18 DIAGNOSIS — I10 Essential (primary) hypertension: Secondary | ICD-10-CM | POA: Diagnosis not present

## 2020-11-18 LAB — ECHOCARDIOGRAM COMPLETE
AR max vel: 2.43 cm2
AV Area VTI: 1.89 cm2
AV Area mean vel: 1.94 cm2
AV Mean grad: 3 mmHg
AV Peak grad: 4.2 mmHg
Ao pk vel: 1.02 m/s
Area-P 1/2: 2.28 cm2
Calc EF: 46.3 %
MV VTI: 1.02 cm2
S' Lateral: 3.2 cm
Single Plane A2C EF: 47.4 %
Single Plane A4C EF: 44.4 %

## 2020-11-18 LAB — CBC
HCT: 42.7 % (ref 36.0–46.0)
Hemoglobin: 14.1 g/dL (ref 12.0–15.0)
MCH: 30 pg (ref 26.0–34.0)
MCHC: 33 g/dL (ref 30.0–36.0)
MCV: 90.9 fL (ref 80.0–100.0)
Platelets: 295 10*3/uL (ref 150–400)
RBC: 4.7 MIL/uL (ref 3.87–5.11)
RDW: 13.2 % (ref 11.5–15.5)
WBC: 6.3 10*3/uL (ref 4.0–10.5)
nRBC: 0 % (ref 0.0–0.2)

## 2020-11-18 LAB — BASIC METABOLIC PANEL
Anion gap: 7 (ref 5–15)
BUN: 18 mg/dL (ref 8–23)
CO2: 26 mmol/L (ref 22–32)
Calcium: 10.4 mg/dL — ABNORMAL HIGH (ref 8.9–10.3)
Chloride: 106 mmol/L (ref 98–111)
Creatinine, Ser: 0.8 mg/dL (ref 0.44–1.00)
GFR, Estimated: >60 mL/min (ref >60)
Glucose, Bld: 101 mg/dL — ABNORMAL HIGH (ref 70–99)
Potassium: 4.5 mmol/L (ref 3.5–5.1)
Sodium: 139 mmol/L (ref 135–145)

## 2020-11-18 LAB — BRAIN NATRIURETIC PEPTIDE: B Natriuretic Peptide: 111 pg/mL — ABNORMAL HIGH (ref 0.0–100.0)

## 2020-11-18 MED ORDER — ENTRESTO 24-26 MG PO TABS: 1.0000 | ORAL_TABLET | Freq: Two times a day (BID) | ORAL | 3 refills | Status: DC

## 2020-11-18 NOTE — Progress Notes (Signed)
ADVANCED HF CLINIC NOTE  Patient ID: Caitlyn Dunn, female   DOB: Apr 05, 1941, 80 y.o.   MRN: [id number] HPI:  Caitlyn Dunn is a very pleasant 80 year old woman with a history of mitral regurgitation status post mitral valve repair with a ring by Dr. Evelina Dun in May 2007. Preoperatively, her EF was 55%-60% with normal coronaries. However, postoperatively it dropped down to 25% for unclear reasons.   Echocardiogram 12/10 showed an ejection fraction 45-50% with LVIDed 3m with mild MR.  Echo 11/11 showed EF 55-60% with no MR. Echo 04/2011 with EF 45-50%, diffuse hypokinesis, trivial MR with mean gradient 3 mm Hg and valve area 1.36 cm2 Echo (5/15) with EF 50-55%, s/p mitral valve repair with trivial MR, normal RV, and basal inferior hypokinesis. Echo 10/17/16 EF 50-55% s/p MV repair Echo 03/14/18; EF 45-50% MV ring stable mild MR.    [facility]. BP was up, thought to be due to cutting back valsartan to 40 daily (from 40 bid) after talking with pcp and therefore we increased it back to BID.  No edema, orthopnea or PND.   Here for FU visit, doing well, denies dyspnea on exertion.  Taking care of her house by herself (3 stories).  Also keeping her beach house in good order.  Not really exercising.  BP at home around 120-130/70-80.  Improved with going back to BID valsartan  ECHO today 11/18/20 EF 45-50%, stable mild MR Personally reviewed   ROS: All systems negative except as listed in HPI, PMH and Problem List.  Past Medical History:  Diagnosis Date   Allergy    Anxiety    CHF (congestive heart failure) (HCC)    Colon polyps    Heart murmur    s/p MVR by Dr. GEvelina Dunin 08/2005   Hyperlipidemia    Hypertension    LV dysfunction    Echo 2010 EF 45-50%, Echo 2011 EF 55-60%  Family history of CAD  Current Outpatient Medications  Medication Sig Dispense Refill   aspirin 81 MG tablet Take 81 mg by mouth daily.     carvedilol (COREG)  12.5 MG tablet Take 1 tablet (12.5 mg total) by mouth 2 (two) times daily with a meal. Must keep further appointments for refills 180 tablet 0   Cholecalciferol (VITAMIN D3) 2000 units TABS Take by mouth daily.     clindamycin (CLEOCIN) 150 MG capsule 4 capsules ('600mg'$ ) prior to dental work (Patient taking differently: 4 capsules ('600mg'$ ) prior to dental work as needed) 16 capsule 2   Multiple Vitamins-Minerals (MULTIVITAMIN GUMMIES WOMENS) CHEW Chew by mouth daily. Patient takes 2 gummies daily.     omeprazole (PRILOSEC) 20 MG capsule Take 20 mg by mouth daily.     rosuvastatin (CRESTOR) 10 MG tablet TAKE 1 TABLET BY MOUTH EVERY DAY 90 tablet 1   valsartan (DIOVAN) 40 MG tablet TAKE 1 TABLET (40 MG TOTAL) BY MOUTH 2 (TWO) TIMES DAILY. MUST KEEP FURTHER APPOINTMENTS FOR REFILLS 60 tablet 0   No current facility-administered medications for this encounter.     PHYSICAL EXAM: Vitals:   11/18/20 1500  BP: 138/77  Pulse: 64  SpO2: 96%  Weight: 67.3 kg (148 lb 6.4 oz)   General:  Well appearing. No resp difficulty HEENT: normal Neck: supple. no JVD. Carotids 2+ bilat; no bruits. No lymphadenopathy or thryomegaly appreciated. Cor: PMI nondisplaced. Regular rate & rhythm. No rubs, gallops or murmurs. Lungs: clear Abdomen: soft, nontender,  nondistended. No hepatosplenomegaly. No bruits or masses. Good bowel sounds. Extremities: no cyanosis, clubbing, rash, edema Neuro: alert & orientedx3, cranial nerves grossly intact. moves all 4 extremities w/o difficulty. Affect pleasant   ASSESSMENT & PLAN:  1. S/p mitral valve repair:     -- Echo reviewed personally. MVR stable Continue SBE prophylaxis with procedures.      -- Reinforced need for SBP prophylaxis  2. Nonischemic cardiomyopathy: Decreased EF immediately after MV repair likely related to effect of long-standing MR.     -- Echo 12/19 EF 45-50% NYHA I. Volume status ok.     -- Continue carvedilol     -- Encouraged her to be more  active    -- BP better this visit    -- Switch valsartan to Entresto 24/26 BID for added benefit  3. HTN   -- BP better this visit GDMT as above  4. Carotid plaque    -- u/s 8/18 with 1-39% bllaterally. Continue statin.   Katherine Roan MD 11/18/2020  Patient seen and examined with the above-signed Advanced Practice Provider and/or Housestaff. I personally reviewed laboratory data, imaging studies and relevant notes. I independently examined the patient and formulated the important aspects of the plan. I have edited the note to reflect any of my changes or salient points. I have personally discussed the plan with the patient and/or family.  She is doing very well. NYHA I-II. EF remains stable in 45-50% range. Volume status ok   General:  Well appearing. No resp difficulty HEENT: normal Neck: supple. no JVD. Carotids 2+ bilat; no bruits. No lymphadenopathy or thryomegaly appreciated. Cor: PMI nondisplaced. Regular rate & rhythm. No rubs, gallops or murmurs. Lungs: clear Abdomen: soft, nontender, nondistended. No hepatosplenomegaly. No bruits or masses. Good bowel sounds. Extremities: no cyanosis, clubbing, rash, edema Neuro: alert & orientedx3, cranial nerves grossly intact. moves all 4 extremities w/o difficulty. Affect pleasant  Stable NYHA I-II. Volume status ok. Echo looks good today. BP mildly elevated. Will switch valsartan to Entresto.   Glori Bickers, MD  11:27 PM

## 2020-11-18 NOTE — Progress Notes (Signed)
  Echocardiogram 2D Echocardiogram has been performed.  Caitlyn Dunn 11/18/2020, 2:43 PM

## 2020-11-18 NOTE — Patient Instructions (Signed)
STOP Valsartan   START Entresto 24/'26mg'$  twice daily  Routine lab work today. Will notify you of abnormal results  Follow up in 6 months

## 2020-11-19 ENCOUNTER — Telehealth (HOSPITAL_COMMUNITY): Payer: Self-pay | Admitting: Pharmacy Technician

## 2020-11-19 ENCOUNTER — Other Ambulatory Visit (HOSPITAL_COMMUNITY): Payer: Self-pay | Admitting: Internal Medicine

## 2020-11-19 NOTE — Telephone Encounter (Signed)
Patient Advocate Encounter   Received notification from [employer] that prior authorization for Caitlyn Dunn is required.   PA submitted on CoverMyMeds Key [id number] Status is pending   Will continue to follow.

## 2020-11-22 ENCOUNTER — Other Ambulatory Visit (HOSPITAL_COMMUNITY): Payer: Self-pay

## 2020-11-22 NOTE — Telephone Encounter (Signed)
Advanced Heart Failure Patient Advocate Encounter  Prior Authorization for [name] has been approved.    PA# [id number] Effective dates: 11/19/20 through 11/19/21  Patients co-pay is $47  Charlann Boxer, CPhT

## 2020-12-20 ENCOUNTER — Encounter: Payer: Self-pay | Admitting: Family Medicine

## 2020-12-20 ENCOUNTER — Ambulatory Visit (INDEPENDENT_AMBULATORY_CARE_PROVIDER_SITE_OTHER): Payer: MEDICARE | Admitting: Family Medicine

## 2020-12-20 ENCOUNTER — Other Ambulatory Visit: Payer: Self-pay

## 2020-12-20 ENCOUNTER — Telehealth: Payer: Self-pay

## 2020-12-20 VITALS — BP 128/74 | HR 63 | Temp 98.0°F | Resp 16 | Ht 63.0 in | Wt 147.6 lb

## 2020-12-20 DIAGNOSIS — E785 Hyperlipidemia, unspecified: Secondary | ICD-10-CM | POA: Diagnosis not present

## 2020-12-20 DIAGNOSIS — H259 Unspecified age-related cataract: Secondary | ICD-10-CM | POA: Diagnosis not present

## 2020-12-20 DIAGNOSIS — I10 Essential (primary) hypertension: Secondary | ICD-10-CM

## 2020-12-20 DIAGNOSIS — H269 Unspecified cataract: Secondary | ICD-10-CM | POA: Insufficient documentation

## 2020-12-20 DIAGNOSIS — Z7185 Encounter for immunization safety counseling: Secondary | ICD-10-CM | POA: Diagnosis not present

## 2020-12-20 DIAGNOSIS — Z23 Encounter for immunization: Secondary | ICD-10-CM

## 2020-12-20 LAB — BASIC METABOLIC PANEL
BUN: 19 mg/dL (ref 6–23)
CO2: 26 mEq/L (ref 19–32)
Calcium: 10.1 mg/dL (ref 8.4–10.5)
Chloride: 104 mEq/L (ref 96–112)
Creatinine, Ser: 0.91 mg/dL (ref 0.40–1.20)
GFR: 60.18 mL/min (ref >60.00)
Glucose, Bld: 98 mg/dL (ref 70–99)
Potassium: 4.6 mEq/L (ref 3.5–5.1)
Sodium: 140 mEq/L (ref 135–145)

## 2020-12-20 LAB — HEPATIC FUNCTION PANEL
ALT: 12 U/L (ref 0–35)
AST: 14 U/L (ref 0–37)
Albumin: 4.5 g/dL (ref 3.5–5.2)
Alkaline Phosphatase: 63 U/L (ref 39–117)
Bilirubin, Direct: 0.1 mg/dL (ref 0.0–0.3)
Total Bilirubin: 0.6 mg/dL (ref 0.2–1.2)
Total Protein: 7.1 g/dL (ref 6.0–8.3)

## 2020-12-20 LAB — CBC WITH DIFFERENTIAL/PLATELET
Basophils Absolute: 0.1 10*3/uL (ref 0.0–0.1)
Basophils Relative: 1.2 % (ref 0.0–3.0)
Eosinophils Absolute: 0.3 10*3/uL (ref 0.0–0.7)
Eosinophils Relative: 4.8 % (ref 0.0–5.0)
HCT: 41.4 % (ref 36.0–46.0)
Hemoglobin: 13.6 g/dL (ref 12.0–15.0)
Lymphocytes Relative: 31.3 % (ref 12.0–46.0)
Lymphs Abs: 1.8 10*3/uL (ref 0.7–4.0)
MCHC: 32.8 g/dL (ref 30.0–36.0)
MCV: 89.2 fl (ref 78.0–100.0)
Monocytes Absolute: 0.5 10*3/uL (ref 0.1–1.0)
Monocytes Relative: 9.1 % (ref 3.0–12.0)
Neutro Abs: 3.1 10*3/uL (ref 1.4–7.7)
Neutrophils Relative %: 53.6 % (ref 43.0–77.0)
Platelets: 251 10*3/uL (ref 150.0–400.0)
RBC: 4.64 Mil/uL (ref 3.87–5.11)
RDW: 13.6 % (ref 11.5–15.5)
WBC: 5.8 10*3/uL (ref 4.0–10.5)

## 2020-12-20 LAB — TSH: TSH: 1.26 u[IU]/mL (ref 0.35–5.50)

## 2020-12-20 LAB — LIPID PANEL
Cholesterol: 195 mg/dL (ref 0–200)
HDL: 67.1 mg/dL (ref >39.00)
LDL Cholesterol: 106 mg/dL — ABNORMAL HIGH (ref 0–99)
NonHDL: 128.06
Total CHOL/HDL Ratio: 3
Triglycerides: 109 mg/dL (ref 0.0–149.0)
VLDL: 21.8 mg/dL (ref 0.0–40.0)

## 2020-12-20 NOTE — Assessment & Plan Note (Signed)
New to provider.  Pt would like to make some calls and figure out how she wants to proceed.  She asked for recommendations as to who to call and I recommend [facility] and [facility].  Pt appreciative for info

## 2020-12-20 NOTE — Assessment & Plan Note (Signed)
Chronic problem.  Tolerating Crestor w/o difficulty.  Check labs.  Adjust meds prn  

## 2020-12-20 NOTE — Telephone Encounter (Signed)
It sounds like a localized reaction and thankfully not something to worry about.  These are very common at the site of injections

## 2020-12-20 NOTE — Telephone Encounter (Signed)
Pt had flu shot this morning and she had a rash this morning but it has gone away and she wants this put on her record.   Pt call back [phone]

## 2020-12-20 NOTE — Telephone Encounter (Signed)
Patient stated that she got a rash from a flu shot that she received in our office today. The rash has gone away and patient did not state any pain, swelling or itching but wanted it noted in her chart. Not sure if this would be added as an allergy being that there are no other symptoms associated with it. Please advise.

## 2020-12-20 NOTE — Progress Notes (Signed)
   Subjective:    Patient ID: Caitlyn Dunn, female    DOB: 1941-02-14, 80 y.o.   MRN: [id number]  HPI HTN- chronic problem, on Entresto and Coreg 12.'5mg'$  w/ good control.  Pt reports feeling 'good'.  Caitlyn Dunn is new since last visit w/ me.  She doesn't note much difference.  No CP, SOB, HAs, visual, edema.  Hyperlipidemia- chronic problem, on Crestor '10mg'$  daily.  Denies abd pain, N/V.  Cataracts- pt was told that it is time to remove her cataracts.  She is interested but doesn't feel an urgency.  She would like recommendations.  COVID booster advice- pt has concerns regarding the new booster and whether she should get it.   Review of Systems For ROS see HPI   This visit occurred during the SARS-CoV-2 public health emergency.  Safety protocols were in place, including screening questions prior to the visit, additional usage of staff PPE, and extensive cleaning of exam room while observing appropriate contact time as indicated for disinfecting solutions.      Objective:   Physical Exam Vitals reviewed.  Constitutional:      General: She is not in acute distress.    Appearance: Normal appearance. She is well-developed.  HENT:     Head: Normocephalic and atraumatic.  Eyes:     Conjunctiva/sclera: Conjunctivae normal.     Pupils: Pupils are equal, round, and reactive to light.  Neck:     Thyroid: No thyromegaly.  Cardiovascular:     Rate and Rhythm: Normal rate and regular rhythm.     Pulses: Normal pulses.     Heart sounds: Normal heart sounds. No murmur heard. Pulmonary:     Effort: Pulmonary effort is normal. No respiratory distress.     Breath sounds: Normal breath sounds.  Abdominal:     General: There is no distension.     Palpations: Abdomen is soft.     Tenderness: There is no abdominal tenderness.  Musculoskeletal:     Cervical back: Normal range of motion and neck supple.     Right lower leg: No edema.     Left lower leg: No edema.  Lymphadenopathy:     Cervical:  No cervical adenopathy.  Skin:    General: Skin is warm and dry.  Neurological:     Mental Status: She is alert and oriented to person, place, and time.  Psychiatric:        Behavior: Behavior normal.          Assessment & Plan:   Vaccine counseling- pt wants to get flu shot today.  Has questions and some concerns about the new COVID booster.  Discussed that this will still offer the same protection as previous boosters w/ the added benefit of protection against Omicron.  Advised that this is now the gold standard and the previous boosters will no longer be given.  After our discussion she is agreeable to proceed.

## 2020-12-20 NOTE — Patient Instructions (Signed)
Follow up in 6 months to recheck BP and cholesterol We'll notify you of your lab results and make any changes if needed Keep up the good work on healthy diet and regular exercise- you look great! When you're ready, call and schedule a cataract evaluation Get the new booster when you are ready Call with any questions or concerns Happy Fall!!

## 2020-12-20 NOTE — Assessment & Plan Note (Signed)
Chronic problem.  Currently well controlled on Entresto and Coreg.  Asymptomatic.  Check labs.  No anticipated med changes.  Will continue to follow.

## 2020-12-21 NOTE — Telephone Encounter (Signed)
I spoke with patient and she doing fine. Per you I advised patient that this is a localized reaction and not something to worry about and they can be very common at the injection site. Patient understood. No further concerns at this time.

## 2021-01-29 ENCOUNTER — Other Ambulatory Visit: Payer: Self-pay | Admitting: Family Medicine

## 2021-01-31 ENCOUNTER — Other Ambulatory Visit (HOSPITAL_COMMUNITY): Payer: Self-pay

## 2021-01-31 MED ORDER — ENTRESTO 24-26 MG PO TABS: 1.0000 | ORAL_TABLET | Freq: Two times a day (BID) | ORAL | 4 refills | Status: DC

## 2021-04-26 ENCOUNTER — Encounter: Payer: Self-pay | Admitting: Family Medicine

## 2021-04-26 ENCOUNTER — Ambulatory Visit (INDEPENDENT_AMBULATORY_CARE_PROVIDER_SITE_OTHER): Payer: MEDICARE | Admitting: Family Medicine

## 2021-04-26 VITALS — BP 128/90 | HR 65 | Temp 98.0°F | Resp 16 | Wt 147.6 lb

## 2021-04-26 DIAGNOSIS — M79645 Pain in left finger(s): Secondary | ICD-10-CM | POA: Diagnosis not present

## 2021-04-26 NOTE — Progress Notes (Signed)
° °  Subjective:    Patient ID: Caitlyn Dunn, female    DOB: Aug 12, 1940, 81 y.o.   MRN: [id number]  HPI Finger pain- L middle digit.  No known injury.  'it just started aching and hurting'.  Pt reports if she takes an ASA or Naproxen in the morning 'it does alright'.  Pt reports pain seems localized to PIP joint.  Denies swelling or color change.  Pain has impacted grip.  Sxs started mid December.   Review of Systems For ROS see HPI   This visit occurred during the SARS-CoV-2 public health emergency.  Safety protocols were in place, including screening questions prior to the visit, additional usage of staff PPE, and extensive cleaning of exam room while observing appropriate contact time as indicated for disinfecting solutions.      Objective:   Physical Exam Vitals reviewed.  Constitutional:      General: She is not in acute distress.    Appearance: Normal appearance. She is not ill-appearing.  HENT:     Head: Normocephalic and atraumatic.  Cardiovascular:     Pulses: Normal pulses.  Musculoskeletal:     Comments: Decreased flexion of L middle digit at PIP joint Decreased grip strength of L hand Mild TTP over PIP joint of L middle finger  Skin:    General: Skin is warm and dry.  Neurological:     General: No focal deficit present.     Mental Status: She is alert and oriented to person, place, and time.  Psychiatric:        Mood and Affect: Mood normal.        Behavior: Behavior normal.        Thought Content: Thought content normal.          Assessment & Plan:  Finger pain- new.  No known injury.  No visible swelling, redness, or deformity.  She has decreased flexion of L middle PIP joint and decreased grip strength.  Continue NSAIDs prn, refer to hand specialist.  Pt expressed understanding and is in agreement w/ plan.

## 2021-04-26 NOTE — Patient Instructions (Addendum)
Follow up as needed or as scheduled We'll call you with your Hand specialist referral The Naproxen can cause mild elevations in blood pressure Use OTC Voltaren Gel to help w/ pain relief Ice or heat your finger for pain relief Call with any questions or concerns Hang in there!

## 2021-05-06 ENCOUNTER — Other Ambulatory Visit (HOSPITAL_COMMUNITY): Payer: Self-pay | Admitting: Internal Medicine

## 2021-05-11 DIAGNOSIS — M65332 Trigger finger, left middle finger: Secondary | ICD-10-CM | POA: Diagnosis not present

## 2021-06-08 DIAGNOSIS — M65332 Trigger finger, left middle finger: Secondary | ICD-10-CM | POA: Diagnosis not present

## 2021-06-20 ENCOUNTER — Encounter: Payer: Self-pay | Admitting: Family Medicine

## 2021-06-20 ENCOUNTER — Ambulatory Visit (INDEPENDENT_AMBULATORY_CARE_PROVIDER_SITE_OTHER): Payer: MEDICARE | Admitting: Family Medicine

## 2021-06-20 VITALS — BP 130/80 | HR 80 | Temp 98.6°F | Resp 16 | Wt 148.0 lb

## 2021-06-20 DIAGNOSIS — E785 Hyperlipidemia, unspecified: Secondary | ICD-10-CM | POA: Diagnosis not present

## 2021-06-20 DIAGNOSIS — I5022 Chronic systolic (congestive) heart failure: Secondary | ICD-10-CM | POA: Diagnosis not present

## 2021-06-20 DIAGNOSIS — I10 Essential (primary) hypertension: Secondary | ICD-10-CM | POA: Diagnosis not present

## 2021-06-20 LAB — TSH: TSH: 1.61 u[IU]/mL (ref 0.35–5.50)

## 2021-06-20 LAB — CBC WITH DIFFERENTIAL/PLATELET
Basophils Absolute: 0.1 10*3/uL (ref 0.0–0.1)
Basophils Relative: 1.2 % (ref 0.0–3.0)
Eosinophils Absolute: 0.2 10*3/uL (ref 0.0–0.7)
Eosinophils Relative: 4.6 % (ref 0.0–5.0)
HCT: 40.4 % (ref 36.0–46.0)
Hemoglobin: 13.4 g/dL (ref 12.0–15.0)
Lymphocytes Relative: 34.8 % (ref 12.0–46.0)
Lymphs Abs: 1.9 10*3/uL (ref 0.7–4.0)
MCHC: 33.3 g/dL (ref 30.0–36.0)
MCV: 89.4 fl (ref 78.0–100.0)
Monocytes Absolute: 0.6 10*3/uL (ref 0.1–1.0)
Monocytes Relative: 10.7 % (ref 3.0–12.0)
Neutro Abs: 2.6 10*3/uL (ref 1.4–7.7)
Neutrophils Relative %: 48.7 % (ref 43.0–77.0)
Platelets: 277 10*3/uL (ref 150.0–400.0)
RBC: 4.52 Mil/uL (ref 3.87–5.11)
RDW: 13.7 % (ref 11.5–15.5)
WBC: 5.4 10*3/uL (ref 4.0–10.5)

## 2021-06-20 LAB — HEPATIC FUNCTION PANEL
ALT: 13 U/L (ref 0–35)
AST: 16 U/L (ref 0–37)
Albumin: 4.5 g/dL (ref 3.5–5.2)
Alkaline Phosphatase: 54 U/L (ref 39–117)
Bilirubin, Direct: 0.1 mg/dL (ref 0.0–0.3)
Total Bilirubin: 0.6 mg/dL (ref 0.2–1.2)
Total Protein: 6.7 g/dL (ref 6.0–8.3)

## 2021-06-20 LAB — BASIC METABOLIC PANEL
BUN: 13 mg/dL (ref 6–23)
CO2: 26 mEq/L (ref 19–32)
Calcium: 10 mg/dL (ref 8.4–10.5)
Chloride: 104 mEq/L (ref 96–112)
Creatinine, Ser: 0.9 mg/dL (ref 0.40–1.20)
GFR: 60.77 mL/min (ref >60.00)
Glucose, Bld: 89 mg/dL (ref 70–99)
Potassium: 4.3 mEq/L (ref 3.5–5.1)
Sodium: 141 mEq/L (ref 135–145)

## 2021-06-20 LAB — LIPID PANEL
Cholesterol: 191 mg/dL (ref 0–200)
HDL: 74.8 mg/dL (ref >39.00)
LDL Cholesterol: 91 mg/dL (ref 0–99)
NonHDL: 115.96
Total CHOL/HDL Ratio: 3
Triglycerides: 124 mg/dL (ref 0.0–149.0)
VLDL: 24.8 mg/dL (ref 0.0–40.0)

## 2021-06-20 NOTE — Assessment & Plan Note (Addendum)
Chronic problem.  Tolerating statin w/o difficulty.  Check labs.  Adjust meds prn  

## 2021-06-20 NOTE — Progress Notes (Signed)
? ?  Subjective:  ? ? Patient ID: Caitlyn Dunn, female    DOB: 11/19/1940, 81 y.o.   MRN: [id number] ? ?HPI ?HTN- chronic problem, on Coreg 12.'5mg'$  BID, Entresto 24/'26mg'$ .  Pt reports feeling 'fine'.  No CP, SOB, HAs, visual changes. ? ?Hyperlipidemia- chronic problem, on Crestor '10mg'$  daily.  No abd pain, N/V. ? ?CHF- following w/ Dr Haroldine Laws.  On Coreg and Entresto.  No swelling of hands/feet.  Pt weighs herself daily.  Pt is able to go up and down the 4 flights of stairs at home w/o difficulty. ? ? ?Review of Systems ?For ROS see HPI  ? ?This visit occurred during the SARS-CoV-2 public health emergency.  Safety protocols were in place, including screening questions prior to the visit, additional usage of staff PPE, and extensive cleaning of exam room while observing appropriate contact time as indicated for disinfecting solutions.   ?   ?Objective:  ? Physical Exam ?Vitals reviewed.  ?Constitutional:   ?   General: She is not in acute distress. ?   Appearance: Normal appearance. She is well-developed. She is not ill-appearing.  ?HENT:  ?   Head: Normocephalic and atraumatic.  ?Eyes:  ?   Conjunctiva/sclera: Conjunctivae normal.  ?   Pupils: Pupils are equal, round, and reactive to light.  ?Neck:  ?   Thyroid: No thyromegaly.  ?Cardiovascular:  ?   Rate and Rhythm: Normal rate and regular rhythm.  ?   Pulses: Normal pulses.  ?   Heart sounds: Normal heart sounds. No murmur heard. ?Pulmonary:  ?   Effort: Pulmonary effort is normal. No respiratory distress.  ?   Breath sounds: Normal breath sounds.  ?Abdominal:  ?   General: There is no distension.  ?   Palpations: Abdomen is soft.  ?   Tenderness: There is no abdominal tenderness.  ?Musculoskeletal:  ?   Cervical back: Normal range of motion and neck supple.  ?   Right lower leg: No edema.  ?   Left lower leg: No edema.  ?Lymphadenopathy:  ?   Cervical: No cervical adenopathy.  ?Skin: ?   General: Skin is warm and dry.  ?Neurological:  ?   Mental Status: She is alert  and oriented to person, place, and time.  ?Psychiatric:     ?   Behavior: Behavior normal.  ? ? ? ? ? ?   ?Assessment & Plan:  ? ? ?

## 2021-06-20 NOTE — Assessment & Plan Note (Signed)
Chronic problem.  Following w/ Dr Haroldine Laws.  She is doing very well on beta blocker and Entresto.  Weighs herself daily to monitor for fluid overload.  Will continue to follow along and assist as able. ?

## 2021-06-20 NOTE — Patient Instructions (Signed)
Follow up in 6 months to recheck BP and cholesterol We'll notify you of your lab results and make any changes if needed Continue to work on healthy diet and regular exercise- you're doing great! Call with any questions or concerns Stay Safe!  Stay Healthy! Happy Spring!!! 

## 2021-06-20 NOTE — Assessment & Plan Note (Signed)
Chronic problem.  Well controlled on Coreg and Entresto.  Pt reports feeling well.  Check labs due to [facility] but no anticipated med changes.  Will follow. ?

## 2021-06-22 ENCOUNTER — Telehealth: Payer: Self-pay

## 2021-06-22 NOTE — Telephone Encounter (Signed)
-----   Message from Midge Minium, MD sent at 06/21/2021  7:41 AM EDT ----- ?Labs look great!  No changes at this time ?

## 2021-06-22 NOTE — Telephone Encounter (Signed)
Patient aware of labs.  

## 2021-08-17 ENCOUNTER — Other Ambulatory Visit: Payer: Self-pay | Admitting: Family Medicine

## 2021-09-15 ENCOUNTER — Other Ambulatory Visit: Payer: Self-pay | Admitting: Family Medicine

## 2021-10-13 ENCOUNTER — Other Ambulatory Visit: Payer: Self-pay | Admitting: Family Medicine

## 2021-10-13 DIAGNOSIS — Z1231 Encounter for screening mammogram for malignant neoplasm of breast: Secondary | ICD-10-CM

## 2021-10-24 ENCOUNTER — Ambulatory Visit
Admission: RE | Admit: 2021-10-24 | Discharge: 2021-10-24 | Disposition: A | Discharge status: Home / Self Care | Payer: MEDICARE | Source: Ambulatory Visit | Attending: Family Medicine | Admitting: Family Medicine

## 2021-10-24 DIAGNOSIS — Z1231 Encounter for screening mammogram for malignant neoplasm of breast: Secondary | ICD-10-CM | POA: Diagnosis not present

## 2021-10-25 ENCOUNTER — Telehealth (HOSPITAL_COMMUNITY): Payer: Self-pay | Admitting: Pharmacy Technician

## 2021-10-25 ENCOUNTER — Other Ambulatory Visit (HOSPITAL_COMMUNITY): Payer: Self-pay

## 2021-10-25 NOTE — Telephone Encounter (Signed)
Advanced Heart Failure Patient Advocate Encounter  Received PA request for Entresto through [facility]. Upon further review, insurance would not allow PA submission at this time. At this time, its a refill too soon and PA is not required.  Charlann Boxer, CPhT

## 2021-10-26 DIAGNOSIS — L814 Other melanin hyperpigmentation: Secondary | ICD-10-CM | POA: Diagnosis not present

## 2021-10-26 DIAGNOSIS — D225 Melanocytic nevi of trunk: Secondary | ICD-10-CM | POA: Diagnosis not present

## 2021-10-26 DIAGNOSIS — L738 Other specified follicular disorders: Secondary | ICD-10-CM | POA: Diagnosis not present

## 2021-10-26 DIAGNOSIS — L821 Other seborrheic keratosis: Secondary | ICD-10-CM | POA: Diagnosis not present

## 2021-10-26 DIAGNOSIS — L7211 Pilar cyst: Secondary | ICD-10-CM | POA: Diagnosis not present

## 2021-10-26 DIAGNOSIS — D1801 Hemangioma of skin and subcutaneous tissue: Secondary | ICD-10-CM | POA: Diagnosis not present

## 2021-11-10 ENCOUNTER — Other Ambulatory Visit (HOSPITAL_COMMUNITY): Payer: Self-pay

## 2021-11-10 MED ORDER — ENTRESTO 24-26 MG PO TABS: 1.0000 | ORAL_TABLET | Freq: Two times a day (BID) | ORAL | 1 refills | Status: DC

## 2021-11-14 ENCOUNTER — Encounter (HOSPITAL_COMMUNITY): Payer: Self-pay | Admitting: Cardiology

## 2021-11-14 ENCOUNTER — Other Ambulatory Visit (HOSPITAL_COMMUNITY): Payer: Self-pay | Admitting: Internal Medicine

## 2021-11-17 DIAGNOSIS — L7211 Pilar cyst: Secondary | ICD-10-CM | POA: Diagnosis not present

## 2021-12-21 ENCOUNTER — Ambulatory Visit (INDEPENDENT_AMBULATORY_CARE_PROVIDER_SITE_OTHER): Payer: MEDICARE | Admitting: Family Medicine

## 2021-12-21 ENCOUNTER — Encounter: Payer: Self-pay | Admitting: Family Medicine

## 2021-12-21 ENCOUNTER — Other Ambulatory Visit: Payer: Self-pay

## 2021-12-21 VITALS — BP 134/72 | HR 75 | Temp 97.8°F | Resp 18 | Ht 63.0 in | Wt 147.2 lb

## 2021-12-21 DIAGNOSIS — I10 Essential (primary) hypertension: Secondary | ICD-10-CM

## 2021-12-21 DIAGNOSIS — Z23 Encounter for immunization: Secondary | ICD-10-CM

## 2021-12-21 DIAGNOSIS — M8580 Other specified disorders of bone density and structure, unspecified site: Secondary | ICD-10-CM | POA: Diagnosis not present

## 2021-12-21 DIAGNOSIS — E559 Vitamin D deficiency, unspecified: Secondary | ICD-10-CM | POA: Diagnosis not present

## 2021-12-21 DIAGNOSIS — E785 Hyperlipidemia, unspecified: Secondary | ICD-10-CM

## 2021-12-21 LAB — CBC WITH DIFFERENTIAL/PLATELET
Basophils Absolute: 0.1 10*3/uL (ref 0.0–0.1)
Basophils Relative: 1.2 % (ref 0.0–3.0)
Eosinophils Absolute: 0.3 10*3/uL (ref 0.0–0.7)
Eosinophils Relative: 5.2 % — ABNORMAL HIGH (ref 0.0–5.0)
HCT: 40.8 % (ref 36.0–46.0)
Hemoglobin: 13.3 g/dL (ref 12.0–15.0)
Lymphocytes Relative: 31.5 % (ref 12.0–46.0)
Lymphs Abs: 1.7 10*3/uL (ref 0.7–4.0)
MCHC: 32.7 g/dL (ref 30.0–36.0)
MCV: 89.8 fl (ref 78.0–100.0)
Monocytes Absolute: 0.5 10*3/uL (ref 0.1–1.0)
Monocytes Relative: 9 % (ref 3.0–12.0)
Neutro Abs: 2.8 10*3/uL (ref 1.4–7.7)
Neutrophils Relative %: 53.1 % (ref 43.0–77.0)
Platelets: 261 10*3/uL (ref 150.0–400.0)
RBC: 4.54 Mil/uL (ref 3.87–5.11)
RDW: 13.6 % (ref 11.5–15.5)
WBC: 5.3 10*3/uL (ref 4.0–10.5)

## 2021-12-21 LAB — BASIC METABOLIC PANEL
BUN: 15 mg/dL (ref 6–23)
CO2: 28 mEq/L (ref 19–32)
Calcium: 10.2 mg/dL (ref 8.4–10.5)
Chloride: 104 mEq/L (ref 96–112)
Creatinine, Ser: 0.88 mg/dL (ref 0.40–1.20)
GFR: 62.21 mL/min (ref >60.00)
Glucose, Bld: 93 mg/dL (ref 70–99)
Potassium: 4.4 mEq/L (ref 3.5–5.1)
Sodium: 140 mEq/L (ref 135–145)

## 2021-12-21 LAB — VITAMIN D 25 HYDROXY (VIT D DEFICIENCY, FRACTURES): VITD: 53.21 ng/mL (ref 30.00–100.00)

## 2021-12-21 LAB — HEPATIC FUNCTION PANEL
ALT: 12 U/L (ref 0–35)
AST: 18 U/L (ref 0–37)
Albumin: 4.4 g/dL (ref 3.5–5.2)
Alkaline Phosphatase: 63 U/L (ref 39–117)
Bilirubin, Direct: 0.1 mg/dL (ref 0.0–0.3)
Total Bilirubin: 0.6 mg/dL (ref 0.2–1.2)
Total Protein: 7.4 g/dL (ref 6.0–8.3)

## 2021-12-21 LAB — LIPID PANEL
Cholesterol: 191 mg/dL (ref 0–200)
HDL: 70 mg/dL (ref >39.00)
LDL Cholesterol: 96 mg/dL (ref 0–99)
NonHDL: 120.85
Total CHOL/HDL Ratio: 3
Triglycerides: 124 mg/dL (ref 0.0–149.0)
VLDL: 24.8 mg/dL (ref 0.0–40.0)

## 2021-12-21 LAB — TSH: TSH: 1.1 u[IU]/mL (ref 0.35–5.50)

## 2021-12-21 NOTE — Patient Instructions (Signed)
Follow up in 6 months to recheck cholesterol We'll notify you of your lab results and make any changes if needed Keep up the good work!  You look great!! Call with any questions or concerns Happy Fall!!

## 2021-12-21 NOTE — Assessment & Plan Note (Signed)
Chronic problem.  On Crestor 10mg daily w/o difficulty.  Check labs.  Adjust meds prn  

## 2021-12-21 NOTE — Assessment & Plan Note (Signed)
Pt has hx of deficiency.  Check labs and replete prn.

## 2021-12-21 NOTE — Progress Notes (Signed)
   Subjective:    Patient ID: Caitlyn Dunn, female    DOB: 1940-06-04, 81 y.o.   MRN: [id number]  HPI HTN- chronic problem, on Coreg 12.'5mg'$  BID and Entresto 24/'26mg'$  BID w/ good control.  'i'm feeling fine'.  Denies CP, SOB, HAs, visual changes, edema.  Hyperlipidemia- chronic problem, on Crestor '10mg'$  daily.  Denies abd pain, N/V.  Osteopenia/Vit D deficiency- overdue for DEXA and Vit D level.  Pt is not interested in repeating DEXA.   Review of Systems For ROS see HPI     Objective:   Physical Exam Vitals reviewed.  Constitutional:      General: She is not in acute distress.    Appearance: Normal appearance. She is well-developed. She is not ill-appearing.  HENT:     Head: Normocephalic and atraumatic.  Eyes:     Conjunctiva/sclera: Conjunctivae normal.     Pupils: Pupils are equal, round, and reactive to light.  Neck:     Thyroid: No thyromegaly.  Cardiovascular:     Rate and Rhythm: Normal rate and regular rhythm.     Pulses: Normal pulses.     Heart sounds: Normal heart sounds. No murmur heard. Pulmonary:     Effort: Pulmonary effort is normal. No respiratory distress.     Breath sounds: Normal breath sounds.  Abdominal:     General: There is no distension.     Palpations: Abdomen is soft.     Tenderness: There is no abdominal tenderness.  Musculoskeletal:     Cervical back: Normal range of motion and neck supple.     Right lower leg: No edema.     Left lower leg: No edema.  Lymphadenopathy:     Cervical: No cervical adenopathy.  Skin:    General: Skin is warm and dry.  Neurological:     General: No focal deficit present.     Mental Status: She is alert and oriented to person, place, and time.  Psychiatric:        Mood and Affect: Mood normal.        Behavior: Behavior normal.           Assessment & Plan:

## 2021-12-21 NOTE — Assessment & Plan Note (Signed)
Chronic problem.  On Coreg 12.'5mg'$  BID and Entresto 24/'26mg'$  BID w/ good control.  Currently asymptomatic.  Check labs due to [facility] but no anticipated med changes.  Will follow.

## 2021-12-21 NOTE — Assessment & Plan Note (Signed)
Pt is not interested in repeating DEXA today.  She doesn't feel like she is having issues and not interested in pursuing at this time

## 2022-01-25 ENCOUNTER — Ambulatory Visit (HOSPITAL_COMMUNITY)
Admission: RE | Admit: 2022-01-25 | Discharge: 2022-01-25 | Disposition: A | Discharge status: Home / Self Care | Payer: MEDICARE | Source: Ambulatory Visit | Attending: Internal Medicine | Admitting: Internal Medicine

## 2022-01-25 VITALS — BP 100/70 | HR 63 | Wt 148.0 lb

## 2022-01-25 DIAGNOSIS — Z9889 Other specified postprocedural states: Secondary | ICD-10-CM | POA: Diagnosis not present

## 2022-01-25 DIAGNOSIS — Z79899 Other long term (current) drug therapy: Secondary | ICD-10-CM | POA: Insufficient documentation

## 2022-01-25 DIAGNOSIS — I10 Essential (primary) hypertension: Secondary | ICD-10-CM

## 2022-01-25 DIAGNOSIS — I428 Other cardiomyopathies: Secondary | ICD-10-CM | POA: Insufficient documentation

## 2022-01-25 DIAGNOSIS — Z952 Presence of prosthetic heart valve: Secondary | ICD-10-CM | POA: Insufficient documentation

## 2022-01-25 DIAGNOSIS — I5022 Chronic systolic (congestive) heart failure: Secondary | ICD-10-CM

## 2022-01-25 DIAGNOSIS — I110 Hypertensive heart disease with heart failure: Secondary | ICD-10-CM | POA: Insufficient documentation

## 2022-01-25 NOTE — Progress Notes (Signed)
ADVANCED HF CLINIC NOTE  Patient ID: Caitlyn Dunn, female   DOB: [birth year], 81 y.o.   MRN: [id number] HPI:  Caitlyn Dunn is a very pleasant 81 year old woman with a history of mitral regurgitation status post mitral valve repair with a ring by Dr. Evelina Dun in May 2007. Preoperatively, her EF was 55%-60% with normal coronaries. However, postoperatively it dropped down to 25% for unclear reasons.   Echocardiogram 12/10 showed an ejection fraction 45-50% with LVIDed 71m with mild MR.  Echo 11/11 showed EF 55-60% with no MR. Echo 04/2011 with EF 45-50%, diffuse hypokinesis, trivial MR with mean gradient 3 mm Hg and valve area 1.36 cm2 Echo (5/15) with EF 50-55%, s/p mitral valve repair with trivial MR, normal RV, and basal inferior hypokinesis. Echo 10/17/16 EF 50-55% s/p MV repair Echo 03/14/18; EF 45-50% MV ring stable mild MR.   [facility]. BP was up, thought to be due to cutting back valsartan to 40 daily (from 40 bid) after talking with pcp and therefore we increased it back to BID.  No edema, orthopnea or PND.   She is here for routine f/u. Taking care of her house by herself (3 stories).  Also keeping her beach house in good order. Remains active. Denies CP or SOB. Up and down steps without problem. Checks BP regularly 120-130  ECHO 11/18/20 EF 45-50%, stable mild MR   ROS: All systems negative except as listed in HPI, PMH and Problem List.  Past Medical History:  Diagnosis Date   Allergy    Anxiety    CHF (congestive heart failure) (HCC)    Colon polyps    Heart murmur    s/p MVR by Dr. GEvelina Dunin 08/2005   Hyperlipidemia    Hypertension    LV dysfunction    Echo 2010 EF 45-50%, Echo 2011 EF 55-60%  Family history of CAD  Current Outpatient Medications  Medication Sig Dispense Refill   aspirin 81 MG tablet Take 81 mg by mouth daily.     carvedilol (COREG) 12.5 MG tablet TAKE 1 TABLET BY MOUTH TWICE A DAY WITH MEALS 180  tablet 3   Cholecalciferol (VITAMIN D3) 2000 units TABS Take by mouth daily.     clindamycin (CLEOCIN) 150 MG capsule 4 CAPSULES ('600MG'$ ) PRIOR TO DENTAL WORK 16 capsule 2   Multiple Vitamins-Minerals (MULTIVITAMIN GUMMIES WOMENS) CHEW Chew by mouth daily. Patient takes 2 gummies daily.     naproxen sodium (ALEVE) 220 MG tablet Take 220 mg by mouth daily as needed.     omeprazole (PRILOSEC) 20 MG capsule Take 20 mg by mouth daily.     rosuvastatin (CRESTOR) 10 MG tablet TAKE 1 TABLET BY MOUTH EVERY DAY 90 tablet 1   sacubitril-valsartan (ENTRESTO) 24-26 MG Take 1 tablet by mouth 2 (two) times daily. Please schedule an appointment for further refills 90 tablet 1   No current facility-administered medications for this encounter.     PHYSICAL EXAM: Vitals:   01/25/22 1040  BP: 100/70  Pulse: 63  SpO2: 97%  Weight: 67.1 kg (148 lb)   General:  Well appearing. No resp difficulty HEENT: normal Neck: supple. no JVD. Carotids 2+ bilat; no bruits. No lymphadenopathy or thryomegaly appreciated. Cor: PMI nondisplaced. Regular rate & rhythm. No rubs, gallops or murmurs. Lungs: clear Abdomen: soft, nontender, nondistended. No hepatosplenomegaly. No bruits or masses. Good bowel sounds. Extremities: no cyanosis, clubbing, rash, edema Neuro: alert & orientedx3, cranial  nerves grossly intact. moves all 4 extremities w/o difficulty. Affect pleasant   ASSESSMENT & PLAN:  1. S/p mitral valve repair:     -- Echo reviewed. MVR stable      -- Reinforced need for SBP prophylaxis. She is doing this religiously     -- Repeat echo next visit   2. Nonischemic cardiomyopathy: Decreased EF immediately after MV repair likely related to effect of long-standing MR.     -- Echo 12/19 EF 45-50%     -- ECHO 11/18/20 EF 45-50%, stable mild MR     -- No HF symptoms. EF stable    -- NYHA I. Volume ok    -- Continue carvedilol     -- Continue Entresto 24/26 BID  3. HTN   -- Well controlled  4. Carotid plaque     -- u/s 8/18 with 1-39% bllaterally. Continue statin.   Glori Bickers MD 01/25/2022

## 2022-01-25 NOTE — Addendum Note (Signed)
Encounter addended by: Jerl Mina, RN on: 01/25/2022 11:14 AM  Actions taken: Visit diagnoses modified, Order list changed, Diagnosis association updated, Clinical Note Signed

## 2022-01-25 NOTE — Patient Instructions (Signed)
There has been no changes to your medications.  Your physician has requested that you have an echocardiogram. Echocardiography is a painless test that uses sound waves to create images of your heart. It provides your doctor with information about the size and shape of your heart and how well your heart's chambers and valves are working. This procedure takes approximately one hour. There are no restrictions for this procedure. Please do NOT wear cologne, perfume, aftershave, or lotions (deodorant is allowed). Please arrive 15 minutes prior to your appointment time.   Your physician recommends that you schedule a follow-up appointment in: 6 months with an echocardiogram ( April 2024)  ** please call the office in February to arrange your follow up appointment **  If you have any questions or concerns before your next appointment please send [place] a message through [place] or call our office at [phone].    TO LEAVE A MESSAGE FOR THE NURSE SELECT OPTION 2, PLEASE LEAVE A MESSAGE INCLUDING: YOUR NAME DATE OF BIRTH CALL BACK NUMBER REASON FOR CALL**this is important as we prioritize the call backs  YOU WILL RECEIVE A CALL BACK THE SAME DAY AS LONG AS YOU CALL BEFORE 4:00 PM  At the [facility], you and your health needs are our priority. As part of our continuing mission to provide you with exceptional heart care, we have created designated Provider Care Teams. These Care Teams include your primary Cardiologist (physician) and Advanced Practice Providers (APPs- Physician Assistants and Nurse Practitioners) who all work together to provide you with the care you need, when you need it.   You may see any of the following providers on your designated Care Team at your next follow up: Dr Glori Bickers Dr Loralie Champagne Dr. Roxana Hires, NP Lyda Jester, [place] [facility] [place], [place] Forestine Na, NP Audry Riles, PharmD   Please be sure to bring in  all your medications bottles to every appointment.

## 2022-01-25 NOTE — Addendum Note (Signed)
Encounter addended by: Jerl Mina, RN on: 01/25/2022 11:22 AM  Actions taken: Clinical Note Signed

## 2022-01-25 NOTE — Progress Notes (Signed)
Medication Samples have been provided to the patient.  Drug name: [name]       Strength: 24/26        Qty: 1 bottle  LOT: [id number]  Exp.Date: 08/25  Dosing instructions: take 1 tablet Twice daily   The patient has been instructed regarding the correct time, dose, and frequency of taking this medication, including desired effects and most common side effects.   Caitlyn Dunn Caitlyn Dunn 11:22 AM 01/25/2022

## 2022-02-15 DIAGNOSIS — M65332 Trigger finger, left middle finger: Secondary | ICD-10-CM | POA: Diagnosis not present

## 2022-02-17 ENCOUNTER — Other Ambulatory Visit: Payer: Self-pay | Admitting: Family Medicine

## 2022-02-27 ENCOUNTER — Telehealth (HOSPITAL_COMMUNITY): Payer: Self-pay

## 2022-02-27 ENCOUNTER — Other Ambulatory Visit (HOSPITAL_COMMUNITY): Payer: Self-pay

## 2022-02-27 ENCOUNTER — Telehealth (HOSPITAL_COMMUNITY): Payer: Self-pay | Admitting: Pharmacy Technician

## 2022-02-27 NOTE — Telephone Encounter (Signed)
Advanced Heart Failure Patient Advocate Encounter  Patient called and left vm stating that she needed approval to get Entresto. Insurance requires PA. Since patient is out of medication, we were able to get a month of samples.   Patient is aware we will continue to follow the status of PA. Will update once approved.

## 2022-02-27 NOTE — Telephone Encounter (Signed)
Advanced Heart Failure Patient Advocate Encounter  Medication Samples have been left at registration desk for patient pick up. Drug name: [name] 24-26 mg Qty: 2x (28 ct) package LOT: [id number] Exp.: 11/2023 SIG: Take 1 tablet by mouth twice daily   The patient has been instructed regarding the correct time, dose, and frequency of taking this medication, including desired effects and most common side effects.   Clista Bernhardt, CPhT Rx Patient Advocate Phone: [phone]

## 2022-02-27 NOTE — Telephone Encounter (Signed)
Patient Advocate Encounter   Received notification from [place] that prior authorization for Caitlyn Dunn is required.   PA submitted on CoverMyMeds Key [id number] Status is pending   Will continue to follow.

## 2022-02-28 NOTE — Telephone Encounter (Signed)
Advanced Heart Failure Patient Advocate Encounter  Prior Authorization for [name] has been approved.    PA# [id number] Effective dates: 02/27/22 through 02/28/23  Called and spoke with the patient.   Charlann Boxer, CPhT

## 2022-06-21 ENCOUNTER — Ambulatory Visit (INDEPENDENT_AMBULATORY_CARE_PROVIDER_SITE_OTHER): Payer: MEDICARE | Admitting: Family Medicine

## 2022-06-21 ENCOUNTER — Encounter: Payer: Self-pay | Admitting: Family Medicine

## 2022-06-21 VITALS — BP 122/70 | HR 63 | Temp 98.4°F | Resp 17 | Ht 63.0 in | Wt 149.2 lb

## 2022-06-21 DIAGNOSIS — E785 Hyperlipidemia, unspecified: Secondary | ICD-10-CM

## 2022-06-21 DIAGNOSIS — M545 Low back pain, unspecified: Secondary | ICD-10-CM

## 2022-06-21 DIAGNOSIS — I10 Essential (primary) hypertension: Secondary | ICD-10-CM

## 2022-06-21 LAB — LIPID PANEL
Cholesterol: 184 mg/dL (ref 0–200)
HDL: 73.6 mg/dL (ref >39.00)
LDL Cholesterol: 90 mg/dL (ref 0–99)
NonHDL: 110.75
Total CHOL/HDL Ratio: 3
Triglycerides: 105 mg/dL (ref 0.0–149.0)
VLDL: 21 mg/dL (ref 0.0–40.0)

## 2022-06-21 LAB — BASIC METABOLIC PANEL WITH GFR
BUN: 16 mg/dL (ref 6–23)
CO2: 28 meq/L (ref 19–32)
Calcium: 10.4 mg/dL (ref 8.4–10.5)
Chloride: 104 meq/L (ref 96–112)
Creatinine, Ser: 0.85 mg/dL (ref 0.40–1.20)
GFR: 64.62 mL/min
Glucose, Bld: 85 mg/dL (ref 70–99)
Potassium: 4.6 meq/L (ref 3.5–5.1)
Sodium: 140 meq/L (ref 135–145)

## 2022-06-21 LAB — CBC WITH DIFFERENTIAL/PLATELET
Basophils Absolute: 0.1 10*3/uL (ref 0.0–0.1)
Basophils Relative: 1.4 % (ref 0.0–3.0)
Eosinophils Absolute: 0.2 10*3/uL (ref 0.0–0.7)
Eosinophils Relative: 4.4 % (ref 0.0–5.0)
HCT: 40.9 % (ref 36.0–46.0)
Hemoglobin: 13.5 g/dL (ref 12.0–15.0)
Lymphocytes Relative: 25 % (ref 12.0–46.0)
Lymphs Abs: 1.4 10*3/uL (ref 0.7–4.0)
MCHC: 33 g/dL (ref 30.0–36.0)
MCV: 89.3 fl (ref 78.0–100.0)
Monocytes Absolute: 0.5 10*3/uL (ref 0.1–1.0)
Monocytes Relative: 8.5 % (ref 3.0–12.0)
Neutro Abs: 3.4 10*3/uL (ref 1.4–7.7)
Neutrophils Relative %: 60.7 % (ref 43.0–77.0)
Platelets: 303 10*3/uL (ref 150.0–400.0)
RBC: 4.57 Mil/uL (ref 3.87–5.11)
RDW: 13.6 % (ref 11.5–15.5)
WBC: 5.6 10*3/uL (ref 4.0–10.5)

## 2022-06-21 LAB — HEPATIC FUNCTION PANEL
ALT: 14 U/L (ref 0–35)
AST: 19 U/L (ref 0–37)
Albumin: 4.4 g/dL (ref 3.5–5.2)
Alkaline Phosphatase: 56 U/L (ref 39–117)
Bilirubin, Direct: 0.1 mg/dL (ref 0.0–0.3)
Total Bilirubin: 0.5 mg/dL (ref 0.2–1.2)
Total Protein: 7.3 g/dL (ref 6.0–8.3)

## 2022-06-21 LAB — TSH: TSH: 1.19 u[IU]/mL (ref 0.35–5.50)

## 2022-06-21 NOTE — Assessment & Plan Note (Signed)
Chronic problem.  Currently well controlled on Coreg 12.'5mg'$  BID and Entresto 24/'26mg'$  BID.  Asymptomatic.  Check labs due to [facility] but no anticipated med changes.  Will follow.

## 2022-06-21 NOTE — Progress Notes (Signed)
   Subjective:    Patient ID: Caitlyn Dunn, female    DOB: 19-Aug-1940, 82 y.o.   MRN: [id number]  HPI HTN- chronic problem, on Coreg 12.5mg  BID, Entresto 24/26mg  BID w/ good control.  No CP, SOB, HA's, visual changes, edema.  Hyperlipidemia- chronic problem, on Crestor 10mg  daily.  Last LDL 96.  No abd pain, N/V.  R LBP- pt reports sxs started ~5 days ago.  Sxs started after car ride to the beach.  Pain is described as a dull ache.  Pain is better with sitting, worse when moving around.  Some relief w/ Naproxen   Review of Systems For ROS see HPI     Objective:   Physical Exam Vitals reviewed.  Constitutional:      General: She is not in acute distress.    Appearance: Normal appearance. She is well-developed. She is not ill-appearing.  HENT:     Head: Normocephalic and atraumatic.  Eyes:     Conjunctiva/sclera: Conjunctivae normal.     Pupils: Pupils are equal, round, and reactive to light.  Neck:     Thyroid: No thyromegaly.  Cardiovascular:     Rate and Rhythm: Normal rate and regular rhythm.     Pulses: Normal pulses.     Heart sounds: Normal heart sounds. No murmur heard. Pulmonary:     Effort: Pulmonary effort is normal. No respiratory distress.     Breath sounds: Normal breath sounds.  Abdominal:     General: There is no distension.     Palpations: Abdomen is soft.     Tenderness: There is no abdominal tenderness.  Musculoskeletal:        General: Tenderness (mild TTP over R SI joint) present.     Cervical back: Normal range of motion and neck supple.  Lymphadenopathy:     Cervical: No cervical adenopathy.  Skin:    General: Skin is warm and dry.  Neurological:     General: No focal deficit present.     Mental Status: She is alert and oriented to person, place, and time.  Psychiatric:        Mood and Affect: Mood normal.        Behavior: Behavior normal.           Assessment & Plan:  R LBP- new.  Pt reports sxs started 5 days ago after a car ride  home from the beach.  She reports sxs are now a dull ache and improving.  Discussed that she likely inflamed her SI joint and that this should continue to improve w/ time.  Reviewed supportive care and red flags that should prompt return.  Pt expressed understanding and is in agreement w/ plan.

## 2022-06-21 NOTE — Patient Instructions (Signed)
Follow up in 6 months to recheck BP and cholesterol We'll notify you of your lab results and make any changes if needed Keep up the good work on healthy diet and regular exercise- you look great! Take Aleve as needed for back pain/inflammation Call with any questions or concerns Stay Safe!  Stay Healthy! Happy Spring!!!

## 2022-06-21 NOTE — Assessment & Plan Note (Signed)
Chronic problem.  Tolerating statin w/o difficulty.  Check labs.  Adjust meds prn  

## 2022-06-22 ENCOUNTER — Telehealth: Payer: Self-pay

## 2022-06-22 NOTE — Telephone Encounter (Signed)
Informed pt of lab results  

## 2022-06-22 NOTE — Telephone Encounter (Signed)
-----   Message from Midge Minium, MD sent at 06/22/2022  7:26 AM EDT ----- Labs look great!  No changes at this time

## 2022-07-15 ENCOUNTER — Other Ambulatory Visit (HOSPITAL_COMMUNITY): Payer: Self-pay | Admitting: Internal Medicine

## 2022-07-24 ENCOUNTER — Ambulatory Visit (HOSPITAL_BASED_OUTPATIENT_CLINIC_OR_DEPARTMENT_OTHER)
Admission: RE | Admit: 2022-07-24 | Discharge: 2022-07-24 | Disposition: A | Discharge status: Home / Self Care | Payer: MEDICARE | Source: Ambulatory Visit | Attending: Internal Medicine | Admitting: Internal Medicine

## 2022-07-24 ENCOUNTER — Ambulatory Visit (HOSPITAL_COMMUNITY)
Admission: RE | Admit: 2022-07-24 | Discharge: 2022-07-24 | Disposition: A | Discharge status: Home / Self Care | Payer: MEDICARE | Source: Ambulatory Visit | Attending: Internal Medicine | Admitting: Internal Medicine

## 2022-07-24 VITALS — BP 138/84 | HR 60 | Wt 149.0 lb

## 2022-07-24 DIAGNOSIS — I428 Other cardiomyopathies: Secondary | ICD-10-CM | POA: Insufficient documentation

## 2022-07-24 DIAGNOSIS — Z8719 Personal history of other diseases of the digestive system: Secondary | ICD-10-CM | POA: Diagnosis not present

## 2022-07-24 DIAGNOSIS — Z79899 Other long term (current) drug therapy: Secondary | ICD-10-CM | POA: Diagnosis not present

## 2022-07-24 DIAGNOSIS — I10 Essential (primary) hypertension: Secondary | ICD-10-CM | POA: Diagnosis not present

## 2022-07-24 DIAGNOSIS — Z8249 Family history of ischemic heart disease and other diseases of the circulatory system: Secondary | ICD-10-CM | POA: Insufficient documentation

## 2022-07-24 DIAGNOSIS — Z9889 Other specified postprocedural states: Secondary | ICD-10-CM | POA: Diagnosis not present

## 2022-07-24 DIAGNOSIS — Z7982 Long term (current) use of aspirin: Secondary | ICD-10-CM | POA: Insufficient documentation

## 2022-07-24 DIAGNOSIS — I509 Heart failure, unspecified: Secondary | ICD-10-CM | POA: Insufficient documentation

## 2022-07-24 DIAGNOSIS — Z952 Presence of prosthetic heart valve: Secondary | ICD-10-CM | POA: Insufficient documentation

## 2022-07-24 DIAGNOSIS — E785 Hyperlipidemia, unspecified: Secondary | ICD-10-CM | POA: Diagnosis not present

## 2022-07-24 DIAGNOSIS — I5022 Chronic systolic (congestive) heart failure: Secondary | ICD-10-CM

## 2022-07-24 DIAGNOSIS — I110 Hypertensive heart disease with heart failure: Secondary | ICD-10-CM | POA: Insufficient documentation

## 2022-07-24 LAB — ECHOCARDIOGRAM COMPLETE
AR max vel: 1.81 cm2
AV Area VTI: 1.72 cm2
AV Area mean vel: 1.78 cm2
AV Mean grad: 2 mmHg
AV Peak grad: 4.2 mmHg
Ao pk vel: 1.03 m/s
Area-P 1/2: 3.46 cm2
Calc EF: 58.9 %
Est EF: 55
S' Lateral: 3.5 cm
Single Plane A2C EF: 62.8 %
Single Plane A4C EF: 53.8 %

## 2022-07-24 NOTE — Addendum Note (Signed)
Encounter addended by: Linda Hedges, RN on: 07/24/2022 12:54 PM  Actions taken: Order list changed, Diagnosis association updated, Clinical Note Signed

## 2022-07-24 NOTE — Patient Instructions (Signed)
No changes to medications.  Your physician has requested that you have an echocardiogram. Echocardiography is a painless test that uses sound waves to create images of your heart. It provides your doctor with information about the size and shape of your heart and how well your heart's chambers and valves are working. This procedure takes approximately one hour. There are no restrictions for this procedure. Please do NOT wear cologne, perfume, aftershave, or lotions (deodorant is allowed). Please arrive 15 minutes prior to your appointment time.  Your physician recommends that you schedule a follow-up appointment in: 1 year with an echocardiogram ( April 2025) ** please call the office in February 2025 to arrange your follow up appointment.**  If you have any questions or concerns before your next appointment please send [place] a message through [place] or call our office at [phone].    TO LEAVE A MESSAGE FOR THE NURSE SELECT OPTION 2, PLEASE LEAVE A MESSAGE INCLUDING: YOUR NAME DATE OF BIRTH CALL BACK NUMBER REASON FOR CALL**this is important as we prioritize the call backs  YOU WILL RECEIVE A CALL BACK THE SAME DAY AS LONG AS YOU CALL BEFORE 4:00 PM  At the [facility], you and your health needs are our priority. As part of our continuing mission to provide you with exceptional heart care, we have created designated Provider Care Teams. These Care Teams include your primary Cardiologist (physician) and Advanced Practice Providers (APPs- Physician Assistants and Nurse Practitioners) who all work together to provide you with the care you need, when you need it.   You may see any of the following providers on your designated Care Team at your next follow up: Dr Arvilla Meres Dr Marca Ancona Dr. Marcos Eke, NP Robbie Lis, [place] [facility] [place], [place] Brynda Peon, NP Karle Plumber, PharmD   Please be sure to bring in all your  medications bottles to every appointment.    Thank you for choosing  [facility]

## 2022-07-24 NOTE — Progress Notes (Signed)
[facility] NOTE  PCP: Sheliah Hatch, MD  Patient ID: Caitlyn Dunn, female   DOB: 12-28-1940, 82 y.o.   MRN: [id number]  HPI:  Caitlyn Dunn is a very pleasant 82 year old woman with a history of mitral regurgitation status post mitral valve repair with a ring by Dr. Silvestre Mesi in May 2007. Preoperatively, her EF was 55%-60% with normal coronaries. However, postoperatively it dropped down to 25% for unclear reasons.   Echocardiogram 12/10 showed an ejection fraction 45-50% with LVIDed 11mm with mild MR.  Echo 11/11 showed EF 55-60% with no MR. Echo 04/2011 with EF 45-50%, diffuse hypokinesis, trivial MR with mean gradient 3 mm Hg and valve area 1.36 cm2 Echo (5/15) with EF 50-55%, s/p mitral valve repair with trivial MR, normal RV, and basal inferior hypokinesis. Echo 10/17/16 EF 50-55% s/p MV repair Echo 03/14/18; EF 45-50% MV ring stable mild MR.   Last visit 04/2018 She went to [facility] and lost 20 pounds. BP was up, thought to be due to cutting back valsartan to 40 daily (from 40 bid) after talking with pcp and therefore we increased it back to BID.  No edema, orthopnea or PND.   She is here for routine f/u. Taking care of her house by herself (3 stories).  Also going to her beach house. Denies CP, SOB or edema. Compliant with meds   ECHO 11/18/20 EF 45-50%, stable mild MR   Echo today 07/24/22 EF 55% trivial MR  ROS: All systems negative except as listed in HPI, PMH and Problem List.  Past Medical History:  Diagnosis Date   Allergy    Anxiety    CHF (congestive heart failure) (HCC)    Colon polyps    Heart murmur    s/p MVR by Dr. Silvestre Mesi in 08/2005   Hyperlipidemia    Hypertension    LV dysfunction    Echo 2010 EF 45-50%, Echo 2011 EF 55-60%  Family history of CAD  Current Outpatient Medications  Medication Sig Dispense Refill   aspirin 81 MG tablet Take 81 mg by mouth daily.     carvedilol (COREG) 12.5 MG tablet TAKE 1 TABLET BY MOUTH TWICE A DAY WITH  MEALS 180 tablet 3   Cholecalciferol (VITAMIN D3) 2000 units TABS Take by mouth daily.     Multiple Vitamins-Minerals (MULTIVITAMIN GUMMIES WOMENS) CHEW Chew by mouth daily. Patient takes 2 gummies daily.     naproxen sodium (ALEVE) 220 MG tablet Take 220 mg by mouth daily as needed.     omeprazole (PRILOSEC) 20 MG capsule Take 20 mg by mouth daily.     rosuvastatin (CRESTOR) 10 MG tablet TAKE 1 TABLET BY MOUTH EVERY DAY 90 tablet 1   sacubitril-valsartan (ENTRESTO) 24-26 MG TAKE 1 TABLET BY MOUTH 2 (TWO) TIMES DAILY. PLEASE SCHEDULE AN APPOINTMENT FOR FURTHER REFILLS 180 tablet 3   clindamycin (CLEOCIN) 150 MG capsule 4 CAPSULES (600MG ) PRIOR TO DENTAL WORK 16 capsule 2   No current facility-administered medications for this encounter.     PHYSICAL EXAM: Vitals:   07/24/22 1152  BP: 138/84  Pulse: 60  SpO2: 97%  Weight: 67.6 kg (149 lb)   General:  Well appearing. No resp difficulty HEENT: normal Neck: supple. no JVD. Carotids 2+ bilat; no bruits. No lymphadenopathy or thryomegaly appreciated. Cor: PMI nondisplaced. Regular rate & rhythm. No rubs, gallops or murmurs. Lungs: clear Abdomen: soft, nontender, nondistended. No hepatosplenomegaly. No bruits or masses. Good bowel sounds. Extremities: no cyanosis, clubbing, rash,  edema Neuro: alert & orientedx3, cranial nerves grossly intact. moves all 4 extremities w/o difficulty. Affect pleasant  ASSESSMENT & PLAN:  1. S/p mitral valve repair in 2007: - Echo today 07/24/22 EF 55% trivial MR - Continue SBE prophylaxis   2. Nonischemic cardiomyopathy: Decreased EF immediately after MV repair likely related to effect of long-standing MR.  - Echo 12/19 EF 45-50%  - ECHO 11/18/20 EF 45-50%, stable mild MR  - Echo today 07/24/22 EF 55% trivial MR - NYHA I Volume status ok - Continue carvedilol  - Continue Entresto 24/26 BID  3. HTN - Blood pressure well controlled. Continue current regimen.  4. Carotid plaque  - u/s 8/18 with 1-39%  bllaterally. Continue statin.   Arvilla Meres MD 07/24/2022

## 2022-08-17 ENCOUNTER — Other Ambulatory Visit: Payer: Self-pay | Admitting: Family Medicine

## 2022-11-18 ENCOUNTER — Other Ambulatory Visit (HOSPITAL_COMMUNITY): Payer: Self-pay | Admitting: Internal Medicine

## 2022-11-23 ENCOUNTER — Encounter (INDEPENDENT_AMBULATORY_CARE_PROVIDER_SITE_OTHER): Payer: Self-pay

## 2022-12-26 ENCOUNTER — Encounter: Payer: Self-pay | Admitting: Family Medicine

## 2022-12-26 ENCOUNTER — Ambulatory Visit (INDEPENDENT_AMBULATORY_CARE_PROVIDER_SITE_OTHER): Payer: MEDICARE | Admitting: Family Medicine

## 2022-12-26 VITALS — BP 122/74 | HR 64 | Temp 97.4°F | Ht 62.5 in | Wt 148.4 lb

## 2022-12-26 DIAGNOSIS — E559 Vitamin D deficiency, unspecified: Secondary | ICD-10-CM

## 2022-12-26 DIAGNOSIS — E785 Hyperlipidemia, unspecified: Secondary | ICD-10-CM

## 2022-12-26 DIAGNOSIS — I10 Essential (primary) hypertension: Secondary | ICD-10-CM

## 2022-12-26 NOTE — Progress Notes (Signed)
Subjective:    Patient ID: Caitlyn Dunn, female    DOB: Jul 08, 1940, 82 y.o.   MRN: [id number]  HPI HTN- chronic problem, on Coreg 12.5mg  BID and Entresto 24/26mg  BID.  No CP, SOB, HA's, visual changes, edema  Hyperlipidemia- chronic problem, on Crestor 10mg  daily.  No abd pain, N/V.  Vit D def- due for repeat labs.   Review of Systems For ROS see HPI     Objective:   Physical Exam Vitals reviewed.  Constitutional:      General: She is not in acute distress.    Appearance: Normal appearance. She is well-developed. She is not ill-appearing.  HENT:     Head: Normocephalic and atraumatic.  Eyes:     Conjunctiva/sclera: Conjunctivae normal.     Pupils: Pupils are equal, round, and reactive to light.  Neck:     Thyroid: No thyromegaly.  Cardiovascular:     Rate and Rhythm: Normal rate and regular rhythm.     Pulses: Normal pulses.     Heart sounds: Normal heart sounds. No murmur heard. Pulmonary:     Effort: Pulmonary effort is normal. No respiratory distress.     Breath sounds: Normal breath sounds.  Abdominal:     General: There is no distension.     Palpations: Abdomen is soft.     Tenderness: There is no abdominal tenderness.  Musculoskeletal:     Cervical back: Normal range of motion and neck supple.     Right lower leg: No edema.     Left lower leg: No edema.  Lymphadenopathy:     Cervical: No cervical adenopathy.  Skin:    General: Skin is warm and dry.  Neurological:     General: No focal deficit present.     Mental Status: She is alert and oriented to person, place, and time.  Psychiatric:        Mood and Affect: Mood normal.        Behavior: Behavior normal.        Thought Content: Thought content normal.           Assessment & Plan:

## 2022-12-26 NOTE — Assessment & Plan Note (Signed)
Chronic problem.  Currently on Crestor 10mg  daily w/o difficulty.  Check labs.  Adjust meds prn

## 2022-12-26 NOTE — Patient Instructions (Signed)
Follow up in 6 months to recheck BP and cholesterol We'll notify you of your lab results and make any changes if needed Keep up the good work on healthy diet and regular exercise- you look great!! Call with any questions or concerns Stay Safe!  Stay Healthy! Happy Fall!!!

## 2022-12-26 NOTE — Assessment & Plan Note (Signed)
Pt is not interested in repeat DEXA at this time.  Check Vit D and replete prn.

## 2022-12-26 NOTE — Assessment & Plan Note (Signed)
Chronic problem.  Currently well controlled on Coreg 12.5mg  BID and Entresto 24/26mg  BID.  Asymptomatic.  Check labs due to use of Entresto but no anticipated med changes.  Will follow.

## 2022-12-27 ENCOUNTER — Ambulatory Visit (INDEPENDENT_AMBULATORY_CARE_PROVIDER_SITE_OTHER): Payer: MEDICARE

## 2022-12-27 DIAGNOSIS — Z23 Encounter for immunization: Secondary | ICD-10-CM

## 2022-12-27 NOTE — Progress Notes (Signed)
Pt was given High Dose Flu shot in Rt deltoid. No issues

## 2022-12-28 ENCOUNTER — Telehealth: Payer: Self-pay

## 2022-12-28 NOTE — Telephone Encounter (Signed)
Pt has been notified.

## 2022-12-28 NOTE — Telephone Encounter (Signed)
These can be taken together.  No concerns

## 2022-12-28 NOTE — Telephone Encounter (Signed)
-----   Message from Neena Rhymes sent at 12/28/2022  7:37 AM EDT ----- Labs look great!  No changes at this time

## 2023-01-09 DIAGNOSIS — L821 Other seborrheic keratosis: Secondary | ICD-10-CM | POA: Diagnosis not present

## 2023-01-09 DIAGNOSIS — L814 Other melanin hyperpigmentation: Secondary | ICD-10-CM | POA: Diagnosis not present

## 2023-01-09 DIAGNOSIS — D1801 Hemangioma of skin and subcutaneous tissue: Secondary | ICD-10-CM | POA: Diagnosis not present

## 2023-01-09 DIAGNOSIS — D225 Melanocytic nevi of trunk: Secondary | ICD-10-CM | POA: Diagnosis not present

## 2023-01-21 DIAGNOSIS — R4702 Dysphasia: Secondary | ICD-10-CM | POA: Diagnosis not present

## 2023-01-21 DIAGNOSIS — T18128A Food in esophagus causing other injury, initial encounter: Secondary | ICD-10-CM | POA: Diagnosis not present

## 2023-01-21 DIAGNOSIS — I10 Essential (primary) hypertension: Secondary | ICD-10-CM | POA: Diagnosis not present

## 2023-01-21 DIAGNOSIS — T18108A Unspecified foreign body in esophagus causing other injury, initial encounter: Secondary | ICD-10-CM | POA: Diagnosis not present

## 2023-01-23 DIAGNOSIS — T18108A Unspecified foreign body in esophagus causing other injury, initial encounter: Secondary | ICD-10-CM | POA: Diagnosis not present

## 2023-01-23 DIAGNOSIS — K209 Esophagitis, unspecified without bleeding: Secondary | ICD-10-CM | POA: Diagnosis not present

## 2023-01-31 ENCOUNTER — Other Ambulatory Visit (HOSPITAL_COMMUNITY): Payer: Self-pay

## 2023-01-31 ENCOUNTER — Telehealth (HOSPITAL_COMMUNITY): Payer: Self-pay

## 2023-01-31 NOTE — Telephone Encounter (Signed)
[facility] Patient Advocate Encounter  Prior authorization for Sherryll Burger has been submitted and approved. Test billing returns $47 for 30 day supply.  KeyEna Dawley Effective: 01/31/2023 to 01/30/2024  Burnell Blanks, CPhT Rx Patient Advocate Phone: [phone]

## 2023-02-16 ENCOUNTER — Other Ambulatory Visit: Payer: Self-pay | Admitting: Family Medicine

## 2023-02-16 NOTE — Telephone Encounter (Signed)
Medication: Rosuvastatin (Crestor) 10 mg Directions: take 1 tablet by mouth daily  Last given: 08/17/22 Number refills: 1 Last o/v: 12/26/22 Follow up: 06/25/23 Labs: 12/26/22

## 2023-05-29 ENCOUNTER — Encounter: Payer: Self-pay | Admitting: Family Medicine

## 2023-05-29 ENCOUNTER — Ambulatory Visit: Payer: Medicare PPO | Admitting: Family Medicine

## 2023-05-29 VITALS — BP 122/64 | HR 86 | Temp 98.2°F | Ht 62.0 in | Wt 147.1 lb

## 2023-05-29 DIAGNOSIS — R051 Acute cough: Secondary | ICD-10-CM

## 2023-05-29 DIAGNOSIS — J101 Influenza due to other identified influenza virus with other respiratory manifestations: Secondary | ICD-10-CM

## 2023-05-29 LAB — POC INFLUENZA A&B (BINAX/QUICKVUE)
Influenza A, POC: POSITIVE — AB
Influenza B, POC: NEGATIVE

## 2023-05-29 MED ORDER — BENZONATATE 200 MG PO CAPS: 200.0000 mg | ORAL_CAPSULE | Freq: Three times a day (TID) | ORAL | 0 refills | Status: DC | PRN

## 2023-05-29 NOTE — Patient Instructions (Signed)
Follow up as needed or as scheduled USE the cough pills as needed ADD OTC Mucinex DM to help w/ both cough and congestion Continue the Zyrtec daily to help w/ sneezing REST! Call with any questions or concerns Hang in there!!!

## 2023-05-29 NOTE — Progress Notes (Unsigned)
   Subjective:    Patient ID: Caitlyn Dunn, female    DOB: 1941/02/25, 83 y.o.   MRN: [id number]  HPI URI- pt reports sxs started 2/11 w/ cough, congestion/runny nose.  No fever.  Denies body aches.  Husband tested + for Flu A yesterday.  Pt reports she is feeling better w/ exception of cough.  Cough is wet but not productive.   Review of Systems For ROS see HPI     Objective:   Physical Exam Vitals reviewed.  Constitutional:      General: She is not in acute distress.    Appearance: Normal appearance. She is not ill-appearing.  HENT:     Head: Normocephalic and atraumatic.     Right Ear: Tympanic membrane and ear canal normal.     Left Ear: Tympanic membrane and ear canal normal.     Nose: Congestion present.     Comments: No TTP over frontal or maxillary sinuses    Mouth/Throat:     Mouth: Mucous membranes are moist.     Pharynx: No oropharyngeal exudate or posterior oropharyngeal erythema.  Eyes:     Extraocular Movements: Extraocular movements intact.     Conjunctiva/sclera: Conjunctivae normal.  Cardiovascular:     Rate and Rhythm: Normal rate and regular rhythm.  Pulmonary:     Effort: Pulmonary effort is normal. No respiratory distress.     Breath sounds: No wheezing or rhonchi.     Comments: + dry, hacking cough Musculoskeletal:     Cervical back: Neck supple.  Lymphadenopathy:     Cervical: No cervical adenopathy.  Neurological:     General: No focal deficit present.     Mental Status: She is alert and oriented to person, place, and time.  Psychiatric:        Mood and Affect: Mood normal.        Behavior: Behavior normal.        Thought Content: Thought content normal.           Assessment & Plan:  Influenza A- new.  Pt w/ faintly + line on rapid test.  She likely had this last week and her husband picked it up from her.  She is recovering well and is outside the window for antiviral medication.  Cough meds prn.  Reviewed supportive care and red flags  that should prompt return.  Pt expressed understanding and is in agreement w/ plan.

## 2023-06-25 ENCOUNTER — Ambulatory Visit: Payer: MEDICARE | Admitting: Family Medicine

## 2023-06-25 ENCOUNTER — Encounter: Payer: Self-pay | Admitting: Family Medicine

## 2023-06-25 VITALS — BP 130/80 | HR 64 | Temp 97.9°F | Wt 148.0 lb

## 2023-06-25 DIAGNOSIS — I10 Essential (primary) hypertension: Secondary | ICD-10-CM

## 2023-06-25 DIAGNOSIS — E785 Hyperlipidemia, unspecified: Secondary | ICD-10-CM | POA: Diagnosis not present

## 2023-06-25 LAB — HEPATIC FUNCTION PANEL
ALT: 12 U/L (ref 0–35)
AST: 18 U/L (ref 0–37)
Albumin: 4.5 g/dL (ref 3.5–5.2)
Alkaline Phosphatase: 66 U/L (ref 39–117)
Bilirubin, Direct: 0.1 mg/dL (ref 0.0–0.3)
Total Bilirubin: 0.6 mg/dL (ref 0.2–1.2)
Total Protein: 7 g/dL (ref 6.0–8.3)

## 2023-06-25 LAB — CBC WITH DIFFERENTIAL/PLATELET
Basophils Absolute: 0 10*3/uL (ref 0.0–0.1)
Basophils Relative: 0.9 % (ref 0.0–3.0)
Eosinophils Absolute: 0.3 10*3/uL (ref 0.0–0.7)
Eosinophils Relative: 5.2 % — ABNORMAL HIGH (ref 0.0–5.0)
HCT: 39 % (ref 36.0–46.0)
Hemoglobin: 12.9 g/dL (ref 12.0–15.0)
Lymphocytes Relative: 33.7 % (ref 12.0–46.0)
Lymphs Abs: 1.6 10*3/uL (ref 0.7–4.0)
MCHC: 33.1 g/dL (ref 30.0–36.0)
MCV: 89.1 fl (ref 78.0–100.0)
Monocytes Absolute: 0.5 10*3/uL (ref 0.1–1.0)
Monocytes Relative: 11.1 % (ref 3.0–12.0)
Neutro Abs: 2.4 10*3/uL (ref 1.4–7.7)
Neutrophils Relative %: 49.1 % (ref 43.0–77.0)
Platelets: 263 10*3/uL (ref 150.0–400.0)
RBC: 4.38 Mil/uL (ref 3.87–5.11)
RDW: 13.7 % (ref 11.5–15.5)
WBC: 4.9 10*3/uL (ref 4.0–10.5)

## 2023-06-25 LAB — LIPID PANEL
Cholesterol: 208 mg/dL — ABNORMAL HIGH (ref 0–200)
HDL: 72.5 mg/dL (ref >39.00)
LDL Cholesterol: 110 mg/dL — ABNORMAL HIGH (ref 0–99)
NonHDL: 135.54
Total CHOL/HDL Ratio: 3
Triglycerides: 127 mg/dL (ref 0.0–149.0)
VLDL: 25.4 mg/dL (ref 0.0–40.0)

## 2023-06-25 LAB — BASIC METABOLIC PANEL
BUN: 14 mg/dL (ref 6–23)
CO2: 24 meq/L (ref 19–32)
Calcium: 9.8 mg/dL (ref 8.4–10.5)
Chloride: 105 meq/L (ref 96–112)
Creatinine, Ser: 0.76 mg/dL (ref 0.40–1.20)
GFR: 73.39 mL/min (ref >60.00)
Glucose, Bld: 88 mg/dL (ref 70–99)
Potassium: 4.2 meq/L (ref 3.5–5.1)
Sodium: 139 meq/L (ref 135–145)

## 2023-06-25 LAB — TSH: TSH: 1.17 u[IU]/mL (ref 0.35–5.50)

## 2023-06-25 NOTE — Progress Notes (Unsigned)
   Subjective:    Patient ID: Caitlyn Dunn, female    DOB: 07-13-1940, 83 y.o.   MRN: [id number]  HPI HTN- chronic problem, on Carvedilol 12.5mg  BID, Entresto 24/26mg  BID.  Pt reports feeling good.  No CP, SOB, HA's, visual changes, edema.  Hyperlipidemia- chronic problem, on Crestor 10mg  daily.  Denies abd pain, N/V   Review of Systems For ROS see HPI     Objective:   Physical Exam Vitals reviewed.  Constitutional:      General: She is not in acute distress.    Appearance: Normal appearance. She is well-developed. She is not ill-appearing.  HENT:     Head: Normocephalic and atraumatic.  Eyes:     Conjunctiva/sclera: Conjunctivae normal.     Pupils: Pupils are equal, round, and reactive to light.  Neck:     Thyroid: No thyromegaly.  Cardiovascular:     Rate and Rhythm: Normal rate and regular rhythm.     Pulses: Normal pulses.     Heart sounds: Normal heart sounds. No murmur heard. Pulmonary:     Effort: Pulmonary effort is normal. No respiratory distress.     Breath sounds: Normal breath sounds.  Abdominal:     General: There is no distension.     Palpations: Abdomen is soft.     Tenderness: There is no abdominal tenderness.  Musculoskeletal:     Cervical back: Normal range of motion and neck supple.     Right lower leg: No edema.     Left lower leg: No edema.  Lymphadenopathy:     Cervical: No cervical adenopathy.  Skin:    General: Skin is warm and dry.  Neurological:     General: No focal deficit present.     Mental Status: She is alert and oriented to person, place, and time.  Psychiatric:        Mood and Affect: Mood normal.        Behavior: Behavior normal.           Assessment & Plan:

## 2023-06-25 NOTE — Patient Instructions (Signed)
 Schedule your complete physical in 6 months We'll notify you of your lab results and make any changes if needed Keep up the good work on healthy diet and regular physical activity- you look great! Call with any questions or concerns Stay Safe!  Stay Healthy! Happy Spring!

## 2023-06-26 ENCOUNTER — Encounter: Payer: Self-pay | Admitting: Family Medicine

## 2023-06-26 NOTE — Telephone Encounter (Signed)
-----   Message from Neena Rhymes sent at 06/26/2023 11:30 AM EDT ----- Your labs look great!  No changes at this time  (Please note- I may not comment on every lab value that is noted as abnormal.  Trust when I say I review all of them, compare them to previous values, and put them in the correct medical context.  If I have any concerns- or any follow up or changes are needed- I promise to let you know)

## 2023-06-26 NOTE — Telephone Encounter (Signed)
 Lab results have been discussed.   Verbalized understanding? Yes  Are there any questions? No

## 2023-06-26 NOTE — Assessment & Plan Note (Signed)
Chronic problem.  Currently on Crestor 10mg daily w/o difficulty.  Check labs.  Adjust meds prn  

## 2023-06-26 NOTE — Assessment & Plan Note (Signed)
 Chronic problem.  On Coreg and Entresto BID.  Currently asymptomatic.  Check labs due to ARB use but no anticipated med changes.

## 2023-07-01 ENCOUNTER — Other Ambulatory Visit (HOSPITAL_COMMUNITY): Payer: Self-pay | Admitting: Internal Medicine

## 2023-08-16 ENCOUNTER — Other Ambulatory Visit: Payer: Self-pay | Admitting: Family Medicine

## 2023-08-16 ENCOUNTER — Other Ambulatory Visit (HOSPITAL_COMMUNITY): Payer: Self-pay | Admitting: Internal Medicine

## 2023-11-19 ENCOUNTER — Telehealth: Payer: Self-pay | Admitting: Family Medicine

## 2023-11-19 NOTE — Telephone Encounter (Unsigned)
 Copied from CRM [id number]. Topic: Clinical - Medication Refill >> Nov 19, 2023  3:44 PM Paige D wrote: Medication:   clindamycin  (CLEOCIN ) 150 MG capsule   Has the patient contacted their pharmacy? Yes (Agent: If no, request that the patient contact the pharmacy for the refill. If patient does not wish to contact the pharmacy document the reason why and proceed with request.) (Agent: If yes, when and what did the pharmacy advise?)  This is the patient's preferred pharmacy:  [id number] [place], [place] - [address] [place] [address] Phone: [phone] Fax: [phone]   Is this the correct pharmacy for this prescription? Yes If no, delete pharmacy and type the correct one.   Has the prescription been filled recently? No  Is the patient out of the medication? Yes  Has the patient been seen for an appointment in the last year OR does the patient have an upcoming appointment? Yes  Can we respond through MyChart? Yes  Agent: Please be advised that Rx refills may take up to 3 business days. We ask that you follow-up with your pharmacy.

## 2023-11-20 MED ORDER — CLINDAMYCIN HCL 150 MG PO CAPS: ORAL_CAPSULE | ORAL | 2 refills | Status: AC

## 2023-12-17 ENCOUNTER — Ambulatory Visit (INDEPENDENT_AMBULATORY_CARE_PROVIDER_SITE_OTHER): Admitting: Family Medicine

## 2023-12-17 ENCOUNTER — Encounter: Payer: Self-pay | Admitting: Family Medicine

## 2023-12-17 VITALS — BP 120/70 | HR 58 | Temp 97.8°F | Ht 62.0 in | Wt 149.6 lb

## 2023-12-17 DIAGNOSIS — Z0000 Encounter for general adult medical examination without abnormal findings: Secondary | ICD-10-CM

## 2023-12-17 DIAGNOSIS — I5022 Chronic systolic (congestive) heart failure: Secondary | ICD-10-CM | POA: Diagnosis not present

## 2023-12-17 DIAGNOSIS — E559 Vitamin D deficiency, unspecified: Secondary | ICD-10-CM | POA: Diagnosis not present

## 2023-12-17 DIAGNOSIS — Z23 Encounter for immunization: Secondary | ICD-10-CM | POA: Diagnosis not present

## 2023-12-17 LAB — TSH: TSH: 0.92 u[IU]/mL (ref 0.35–5.50)

## 2023-12-17 LAB — CBC WITH DIFFERENTIAL/PLATELET
Basophils Absolute: 0.1 K/uL (ref 0.0–0.1)
Basophils Relative: 1.3 % (ref 0.0–3.0)
Eosinophils Absolute: 0.2 K/uL (ref 0.0–0.7)
Eosinophils Relative: 4.2 % (ref 0.0–5.0)
HCT: 39.2 % (ref 36.0–46.0)
Hemoglobin: 12.7 g/dL (ref 12.0–15.0)
Lymphocytes Relative: 34.4 % (ref 12.0–46.0)
Lymphs Abs: 1.6 K/uL (ref 0.7–4.0)
MCHC: 32.5 g/dL (ref 30.0–36.0)
MCV: 88.6 fl (ref 78.0–100.0)
Monocytes Absolute: 0.5 K/uL (ref 0.1–1.0)
Monocytes Relative: 10.3 % (ref 3.0–12.0)
Neutro Abs: 2.3 K/uL (ref 1.4–7.7)
Neutrophils Relative %: 49.8 % (ref 43.0–77.0)
Platelets: 277 K/uL (ref 150.0–400.0)
RBC: 4.42 Mil/uL (ref 3.87–5.11)
RDW: 13.8 % (ref 11.5–15.5)
WBC: 4.6 K/uL (ref 4.0–10.5)

## 2023-12-17 LAB — LIPID PANEL
Cholesterol: 178 mg/dL (ref 0–200)
HDL: 65.5 mg/dL (ref >39.00)
LDL Cholesterol: 91 mg/dL (ref 0–99)
NonHDL: 112.11
Total CHOL/HDL Ratio: 3
Triglycerides: 106 mg/dL (ref 0.0–149.0)
VLDL: 21.2 mg/dL (ref 0.0–40.0)

## 2023-12-17 LAB — VITAMIN D 25 HYDROXY (VIT D DEFICIENCY, FRACTURES): VITD: 44.32 ng/mL (ref 30.00–100.00)

## 2023-12-17 LAB — HEPATIC FUNCTION PANEL
ALT: 10 U/L (ref 0–35)
AST: 16 U/L (ref 0–37)
Albumin: 4.4 g/dL (ref 3.5–5.2)
Alkaline Phosphatase: 62 U/L (ref 39–117)
Bilirubin, Direct: 0.1 mg/dL (ref 0.0–0.3)
Total Bilirubin: 0.6 mg/dL (ref 0.2–1.2)
Total Protein: 7.1 g/dL (ref 6.0–8.3)

## 2023-12-17 LAB — BASIC METABOLIC PANEL WITH GFR
BUN: 12 mg/dL (ref 6–23)
CO2: 25 meq/L (ref 19–32)
Calcium: 10 mg/dL (ref 8.4–10.5)
Chloride: 105 meq/L (ref 96–112)
Creatinine, Ser: 0.82 mg/dL (ref 0.40–1.20)
GFR: 66.77 mL/min (ref >60.00)
Glucose, Bld: 89 mg/dL (ref 70–99)
Potassium: 4.3 meq/L (ref 3.5–5.1)
Sodium: 140 meq/L (ref 135–145)

## 2023-12-17 NOTE — Assessment & Plan Note (Addendum)
 Pt's PE WNL.  She is doing remarkably well.  UTD on PNA.  Flu shot given.  No longer doing colonoscopy or mammogram.  Check labs.  Anticipatory guidance provided.

## 2023-12-17 NOTE — Progress Notes (Signed)
   Subjective:    Patient ID: Caitlyn Dunn, female    DOB: 07-01-1940, 83 y.o.   MRN: [id number]  HPI CPE- UTD on PNA.  No longer doing colonoscopy or mammo.  Pt reports feeling good.  Patient Care Team    Relationship Specialty Notifications Start End  Mahlon Comer BRAVO, MD PCP - General Family Medicine  06/06/11    Comment: Lavell Fuel, Toribio SAUNDERS, MD Consulting Physician Cardiology  08/11/15   Luis Purchase, MD Consulting Physician Gastroenterology  08/11/15   Debrah Ade, MD Consulting Physician Obstetrics and Gynecology  08/11/15   Dermatology, [place]    08/16/16   Jane Charleston, MD Consulting Physician Orthopedic Surgery  08/16/16   [facility]    12/20/20     Health Maintenance  Topic Date Due   Medicare Annual Wellness (AWV)  08/24/2018   Influenza Vaccine  11/09/2023   DTaP/Tdap/Td (2 - Td or Tdap) 02/21/2027   Pneumococcal Vaccine: 50+ Years  Completed   DEXA SCAN  Completed   Zoster Vaccines- Shingrix  Completed   HPV VACCINES  Aged Out   Meningococcal B Vaccine  Aged Out   COVID-19 Vaccine  Discontinued      Review of Systems Patient reports no vision/ hearing changes, adenopathy,fever, weight change,  persistant/recurrent hoarseness, swallowing issues, chest pain, palpitations, edema, persistant/recurrent cough, hemoptysis, dyspnea (rest/exertional/paroxysmal nocturnal), gastrointestinal bleeding (melena, rectal bleeding), abdominal pain, significant heartburn, bowel changes, GU symptoms (dysuria, hematuria, incontinence), Gyn symptoms (abnormal  bleeding, pain),  syncope, focal weakness, memory loss, numbness & tingling, skin/hair/nail changes, abnormal bruising or bleeding, anxiety, or depression.     Objective:   Physical Exam General Appearance:    Alert, cooperative, no distress, appears stated age  Head:    Normocephalic, without obvious abnormality, atraumatic  Eyes:    PERRL, conjunctiva/corneas clear, EOM's intact both eyes  Ears:     Normal TM's and external ear canals, both ears  Nose:   Nares normal, septum midline, mucosa normal, no drainage    or sinus tenderness  Throat:   Lips, mucosa, and tongue normal; teeth and gums normal  Neck:   Supple, symmetrical, trachea midline, no adenopathy;    Thyroid : no enlargement/tenderness/nodules  Back:     Symmetric, no curvature, ROM normal, no CVA tenderness  Lungs:     Clear to auscultation bilaterally, respirations unlabored  Chest Wall:    No tenderness or deformity   Heart:    Regular rate and rhythm, S1 and S2 normal, no murmur, rub   or gallop  Breast Exam:    Deferred  Abdomen:     Soft, non-tender, bowel sounds active all four quadrants,    no masses, no organomegaly  Genitalia:    Deferred  Rectal:    Extremities:   Extremities normal, atraumatic, no cyanosis or edema  Pulses:   2+ and symmetric all extremities  Skin:   Skin color, texture, turgor normal, no rashes or lesions  Lymph nodes:   Cervical, supraclavicular, and axillary nodes normal  Neurologic:   CNII-XII intact, normal strength, sensation and reflexes    throughout          Assessment & Plan:

## 2023-12-17 NOTE — Patient Instructions (Signed)
 Follow up in 6 months to recheck BP and cholesterol We'll notify you of your lab results and make any changes if needed Keep up the good work on healthy diet and regular exercise- you're doing great! Call with any questions or concerns Stay Safe!  Stay Healthy! Enjoy the beach!!!

## 2023-12-18 ENCOUNTER — Ambulatory Visit: Payer: Self-pay | Admitting: Family Medicine

## 2023-12-18 NOTE — Progress Notes (Signed)
 Pt has been notified.

## 2024-02-13 ENCOUNTER — Other Ambulatory Visit: Payer: Self-pay | Admitting: Family Medicine

## 2024-02-18 DIAGNOSIS — H2513 Age-related nuclear cataract, bilateral: Secondary | ICD-10-CM | POA: Diagnosis not present

## 2024-02-18 DIAGNOSIS — H524 Presbyopia: Secondary | ICD-10-CM | POA: Diagnosis not present

## 2024-03-03 DIAGNOSIS — H2513 Age-related nuclear cataract, bilateral: Secondary | ICD-10-CM | POA: Diagnosis not present

## 2024-03-13 DIAGNOSIS — M65331 Trigger finger, right middle finger: Secondary | ICD-10-CM | POA: Diagnosis not present

## 2024-05-10 ENCOUNTER — Other Ambulatory Visit (HOSPITAL_COMMUNITY): Payer: Self-pay | Admitting: Internal Medicine

## 2024-06-17 ENCOUNTER — Ambulatory Visit: Admitting: Family Medicine
